# Patient Record
Sex: Male | Born: 1950 | Race: White | Hispanic: No | Marital: Married | Smoking: Former smoker
Health system: Southern US, Community
[De-identification: ages and names within clinical notes are randomized; demographics above are authoritative.]

## PROBLEM LIST (undated history)

## (undated) DIAGNOSIS — I10 Essential (primary) hypertension: Secondary | ICD-10-CM

## (undated) DIAGNOSIS — N200 Calculus of kidney: Secondary | ICD-10-CM

## (undated) DIAGNOSIS — E7800 Pure hypercholesterolemia, unspecified: Secondary | ICD-10-CM

## (undated) DIAGNOSIS — I639 Cerebral infarction, unspecified: Secondary | ICD-10-CM

## (undated) DIAGNOSIS — M109 Gout, unspecified: Secondary | ICD-10-CM

## (undated) HISTORY — PX: KIDNEY STONE SURGERY: SHX686

---
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
## 2000-01-06 ENCOUNTER — Ambulatory Visit (HOSPITAL_COMMUNITY): Admission: RE | Admit: 2000-01-06 | Discharge: 2000-01-06 | Payer: Self-pay | Admitting: Urology

## 2000-01-06 ENCOUNTER — Encounter: Payer: Self-pay | Admitting: Urology

## 2000-03-02 ENCOUNTER — Ambulatory Visit (HOSPITAL_COMMUNITY): Admission: RE | Admit: 2000-03-02 | Discharge: 2000-03-02 | Payer: Self-pay | Admitting: Urology

## 2000-03-02 ENCOUNTER — Encounter: Payer: Self-pay | Admitting: Urology

## 2003-10-10 ENCOUNTER — Ambulatory Visit (HOSPITAL_COMMUNITY): Admission: RE | Admit: 2003-10-10 | Discharge: 2003-10-10 | Payer: Self-pay | Admitting: Internal Medicine

## 2004-07-16 ENCOUNTER — Emergency Department (HOSPITAL_COMMUNITY): Admission: EM | Admit: 2004-07-16 | Discharge: 2004-07-16 | Payer: Self-pay | Admitting: Emergency Medicine

## 2009-04-09 ENCOUNTER — Ambulatory Visit (HOSPITAL_BASED_OUTPATIENT_CLINIC_OR_DEPARTMENT_OTHER): Admission: RE | Admit: 2009-04-09 | Discharge: 2009-04-09 | Payer: Self-pay | Admitting: Urology

## 2010-11-23 LAB — POCT I-STAT 4, (NA,K, GLUC, HGB,HCT)
Glucose, Bld: 104 mg/dL — ABNORMAL HIGH (ref 70–99)
HCT: 42 % (ref 39.0–52.0)
Hemoglobin: 14.3 g/dL (ref 13.0–17.0)
Potassium: 3.8 mEq/L (ref 3.5–5.1)
Sodium: 142 mEq/L (ref 135–145)

## 2010-12-31 NOTE — Op Note (Signed)
[name], Ralph Martinez                   ACCOUNT NO.:  [id number]   MEDICAL RECORD NO.:  [id number]          PATIENT TYPE:  AMB   LOCATION:  [facility]                         FACILITY:  [facility]   PHYSICIAN:  Mark C. Vernie Ammons, M.D.  DATE OF BIRTH:  [birth year]   DATE OF PROCEDURE:  04/09/2009  DATE OF DISCHARGE:                               OPERATIVE REPORT   PREOPERATIVE DIAGNOSIS:  Bladder calculi.   POSTOPERATIVE DIAGNOSIS:  Bladder calculi.   PROCEDURE:  Cystolitholapaxy (3 cm).   SURGEON:  Mark C. Vernie Ammons, M.D.   ANESTHESIA:  General.   SPECIMENS:  Stone to patient.   DRAINS:  None.   ESTIMATED BLOOD LOSS:  None.   COMPLICATIONS:  None.   INDICATIONS:  The patient is a 60 year old male with bilateral  nephrolithiasis.  He has been having his stream cut off intermittently  and quite suddenly.  He was found by KUB to have 2 large bladder  calculi.  We discussed treatment of these with cystolitholapaxy, as well  as the risks, complications, and alternatives.  The patient understands  and has elected to proceed.   DESCRIPTION OF OPERATION:  After informed consent, the patient was  brought to the major OR and placed on the table, administered general  anesthesia, then moved to the dorsal lithotomy position.  His genitalia  was then sterilely prepped and draped and an official time-out was  performed.  He received Cipro IV.   A 22-French cystoscope with 12-degree lens was then passed under direct  vision down the urethra, which was noted to be entirely normal.  The  sphincter appeared intact and there was very minimal elongation of the  prostatic urethra.  There was no lateral lobe hypertrophy, however,  there was a relatively high bladder neck.  Upon entering the bladder, I  note ureteral orifices were of normal configuration and position.  The  bladder was systematically and fully inspected and noted to be free of  any tumors or inflammatory lesions.  Two large stones were seen on  the  floor of the bladder and were photographed.   Using the 1000-micron holmium laser fiber, I then fragmented the stones  completely and then used the Microvasive evacuator to remove all of the  stone fragments from the bladder.  Re-inspection revealed no stone  fragments present within the bladder and no injury to the bladder  mucosa.  I therefore drained the bladder and the patient was awakened  and taken to recovery room in stable and satisfactory condition.  He  tolerated the procedure well and there were no intraoperative  complications.   He will be given a prescription for Pyridium 200, #28 and Vicodin HP,  #12.  He will then follow up in my office in 1 week.      Mark C. Vernie Ammons, M.D.  Electronically Signed     MCO/MEDQ  D:  04/09/2009  T:  04/09/2009  Job:  [id number]

## 2011-01-03 NOTE — Op Note (Signed)
[facility]  Patient:    Ralph Martinez, Ralph Martinez                            MRN: [id number] Proc. Date: 03/02/00 Adm. Date:  [id number] Attending:  Trisha Mangle                           Operative Report  PREOPERATIVE DIAGNOSIS:  Right ureteral calculus.  POSTOPERATIVE DIAGNOSIS:  Right ureteral calculus and right hydronephrosis.  OPERATION PERFORMED:  Cystoscopy, right retrograde pyelogram with interpretation, right ureteroscopy, electrohydraulic lithotripsy of calculus with stone basketing and extraction and double-J stent placement.  SURGEON:  Mark C. Vernie Ammons, M.D.  ANESTHESIA:  General LMA.  SPECIMENS:  Stone given to patient.  COMPLICATIONS:  None.  ESTIMATED BLOOD LOSS:  Less than 5 cc.  INDICATIONS FOR PROCEDURE:  The patient is a 60 year old white male who had lithotripsy of a large renal calculus.  He began passing the fragments and was noted to have slow progression of the residual fragments down his right ureter.  It had been four weeks since I had seen him last and he had minimal pain except for one episode but was found to have marked hydronephrosis on that side indicating some obstruction.  The need for stone extraction was discussed with the patient fully and is outlined in my office notes which have been placed on the chart.  DESCRIPTION OF PROCEDURE:  After informed consent, patient brought to the major OR and placed on the table and administered general LMA anesthesia. He was then moved to the dorsal lithotomy position and his genitalia were sterilely prepped and draped.  A 22 French cystoscope was then introduced into the bladder.  The urethra was noted to be normal, the sphincter intact and the prostatic urethra nonobstructing without lesions.  There was some mild bilobar hypertrophy.  The bladder itself was free of tumor, stones or inflammatory lesions.  The right ureteral orifice was then identified and a 5  [place] open-ended ureteral catheter was then passed into the right distal ureter and a retrograde pyelogram was performed in the standard fashion.  This revealed filling defect over the sacral region where the calcification was seen on his KUB.  There was marked hydronephrosis proximally and hydroureter seen.  No other filling defects were identified.  A 0.038 inch floppy tip guide wire was then passed through the open-ended stent and up the right ureter in the area of the renal pelvis.  I then removed the cystoscope leaving the guide wire in place and passed the 6 French rigid ureteroscope next to the guide wire.  The stone was identified and visualized by direct visualization, then I passed a Segura basket through the ureteroscope but was unable to engage the stone due to the impaction of the stone in the ureter.  There was some edema of the ureter distally.  I therefore elected to dilate the ureter.  I backloaded the cystoscope over the guide wire and passed the 10 cm dilating balloon over this and dilated to 15 [place] at 16 atmospheres for five minutes. I then deflated the balloon, reinserted the ureteroscope and again found that the stone was so impacted that it could not be extracted.  I therefore passed the 3 French EHL probe through the ureteroscope and directly visualized the probe on the stone fragmenting the stone with ease.  I then reinserted the [employer]  and engaged some of the stone fragments and extracted them without difficulty.  I passed the ureteroscope again, grasped more large fragments removing those and then passed the ureteroscope finally finding no further large fragments as I passed it approximately halfway up the ureter.  I therefore removed the ureteroscope, again backloaded the cystoscope and passed a double-J stent over the guide wire with good curl being noted when the guide wire was removed, in the renal pelvis and in the bladder.  The bladder was then  drained and the cystoscope removed.  The stent had a string attached to its distal aspect exiting through the meatus and was attached to the dorsum of the penis.  The patient was then awakened and taken to the recovery room in stable satisfactory condition.  The patient tolerated the procedure well with no intraoperative complications.  He will be given the stone as well as a prescription for pain medication using Tylox #24 and follow up in my office at the end of the week to get his stent out.  He did receive preop antibiotics. DD:  03/02/00 TD:  03/02/00 Job: [id number]

## 2011-01-03 NOTE — Op Note (Signed)
[name], [name]                             ACCOUNT NO.:  [id number]   MEDICAL RECORD NO.:  [id number]                   PATIENT TYPE:  AMB   LOCATION:  DAY                                  FACILITY:  [facility]   PHYSICIAN:  Lionel December, M.D.                 DATE OF BIRTH:  [birth year]   DATE OF PROCEDURE:  10/10/2003  DATE OF DISCHARGE:                                 OPERATIVE REPORT   PROCEDURE:  Total colonoscopy.   INDICATIONS FOR PROCEDURE:  [name] is a 60 year old Caucasian male who is here  for screening colonoscopy.  History is negative for CRC in first degree  relative, one of his uncles was treated for CRC last year (second degree  relative).   The procedure is reviewed with the patient and informed consent was  obtained.   PREOP MEDICATIONS:  Demerol 50 mg IV, Versed 5 mg IV.   FINDINGS:  Procedure performed in endoscopy suite. The patient's vital signs  and O2 saturations were monitored during the procedure and remained stable.  The patient was placed in the left lateral decubitus position and rectal  examination performed. No abnormalities noted on external or digital exam.  The Olympus videoscope was placed in the rectum and advanced under direct  vision into the sigmoid colon and beyond. The prep was felt to be  satisfactory. The scope was passed in the cecum which was identified by the  appendiceal orifice and ileocecal valve.  As the scope was withdrawn, the  colonic mucosa was carefully examined. There was a 5-6 mm polyp at the  transverse colon which was snared.  The mucosa in the rest of the colon was  normal.  The rectal mucosa similarly was normal.  The scope was retroflexed  to examine the anorectal junction and small hemorrhoids were noted below the  dentate line. The endoscope was straightened and withdrawn. The patient  tolerated the procedure well.   FINAL DIAGNOSES:  1. Single small polyp snared from transverse colon.  2. Small external  hemorrhoids.   RECOMMENDATIONS:  Stent instructions given.  I will be contacting the  patient with biopsy results.  If this polyp is an adenoma, he will return  for colonoscopy in five years.      ___________________________________________                                            Lionel December, M.D.   NR/MEDQ  D:  10/10/2003  T:  10/10/2003  Job:  [id number]   cc:   Kingsley Callander. Ouida Sills, M.D.  [address]  [place]  [place] [address]  Fax: [phone]

## 2011-02-12 ENCOUNTER — Encounter (HOSPITAL_BASED_OUTPATIENT_CLINIC_OR_DEPARTMENT_OTHER): Payer: BC Managed Care – PPO | Admitting: Internal Medicine

## 2011-02-12 ENCOUNTER — Ambulatory Visit (HOSPITAL_COMMUNITY)
Admission: RE | Admit: 2011-02-12 | Discharge: 2011-02-12 | Disposition: A | Discharge status: Home / Self Care | Payer: BC Managed Care – PPO | Source: Ambulatory Visit | Attending: Internal Medicine | Admitting: Internal Medicine

## 2011-02-12 ENCOUNTER — Other Ambulatory Visit (INDEPENDENT_AMBULATORY_CARE_PROVIDER_SITE_OTHER): Payer: Self-pay | Admitting: Internal Medicine

## 2011-02-12 DIAGNOSIS — Z800 Family history of malignant neoplasm of digestive organs: Secondary | ICD-10-CM

## 2011-02-12 DIAGNOSIS — I10 Essential (primary) hypertension: Secondary | ICD-10-CM | POA: Insufficient documentation

## 2011-02-12 DIAGNOSIS — Z860100 Personal history of colon polyps, unspecified: Secondary | ICD-10-CM | POA: Insufficient documentation

## 2011-02-12 DIAGNOSIS — Z79899 Other long term (current) drug therapy: Secondary | ICD-10-CM | POA: Insufficient documentation

## 2011-02-12 DIAGNOSIS — D129 Benign neoplasm of anus and anal canal: Secondary | ICD-10-CM | POA: Insufficient documentation

## 2011-02-12 DIAGNOSIS — D126 Benign neoplasm of colon, unspecified: Secondary | ICD-10-CM | POA: Insufficient documentation

## 2011-02-12 DIAGNOSIS — Z09 Encounter for follow-up examination after completed treatment for conditions other than malignant neoplasm: Secondary | ICD-10-CM | POA: Insufficient documentation

## 2011-02-12 DIAGNOSIS — D128 Benign neoplasm of rectum: Secondary | ICD-10-CM | POA: Insufficient documentation

## 2011-02-12 DIAGNOSIS — K633 Ulcer of intestine: Secondary | ICD-10-CM | POA: Insufficient documentation

## 2011-02-12 DIAGNOSIS — K620 Anal polyp: Secondary | ICD-10-CM

## 2011-02-12 DIAGNOSIS — E785 Hyperlipidemia, unspecified: Secondary | ICD-10-CM | POA: Insufficient documentation

## 2011-02-12 DIAGNOSIS — K644 Residual hemorrhoidal skin tags: Secondary | ICD-10-CM | POA: Insufficient documentation

## 2011-02-25 NOTE — Op Note (Signed)
NAMECASSIE, Ralph Martinez                   ACCOUNT NO.:  [id number]  MEDICAL RECORD NO.:  [id number]  LOCATION:  [facility]                          FACILITY:  [facility]  PHYSICIAN:  Lionel December, M.D.    DATE OF BIRTH:  16-Jun-1951  DATE OF PROCEDURE:  02/12/2011 DATE OF DISCHARGE:                              OPERATIVE REPORT   PROCEDURE:  Colonoscopy with snare polypectomy.  INDICATIONS:  Ralph Martinez is a 60 year old Caucasian male who is here for surveillance colonoscopy.  He had an adenoma removed from his colon in February 2005.  He is presently free of GI symptoms.  Family history is positive for colon carcinoma in two second-degree relatives.  His maternal uncle with colon carcinoma in his 31s and did about 10 years later either of a metastatic disease or second primary and maternal aunt died of colon carcinoma in her early 74s.  Procedure risks were reviewed with the patient.  Informed consent was obtained.  MEDICATIONS FOR CONSCIOUS SEDATION:  Demerol 50 mg IV, Versed 4 mg IV.  FINDINGS:  Procedure performed in endoscopy suite.  The patient's vital signs and O2 saturations were monitored during the procedure and remained stable.  The patient was placed in left lateral recumbent position and rectal examination was performed.  No abnormality noted on external or digital exam.  Pentax videoscope was placed in rectum and advanced under vision into sigmoid colon and beyond.  Preparation was excellent.  Scope was passed into cecum which was identified by appendiceal orifice and ileocecal valve.  Ileocecal valve was bifid.  It was palpated with biopsy forceps and appeared to be unremarkable. Pictures were taken of appendiceal orifice.  Short segment of TI was also examined and there were scattered erosions and few small ulcers, the largest one was 6-7 mm long.  Pictures were taken for the record followed by biopsy.  Most of these changes involved distal 5-6 cm of ileal mucosa.  As the scope was  withdrawn, colonic mucosa was carefully examined.  There was a 3-mm polyp at sigmoid colon which was ablated via cold biopsy.  There was another 6-mm polyp at rectum which was snared. There were 2 smaller polyps at rectum which were coagulated using snare tip.  Scope was retroflexed to examine anorectal junction and small hemorrhoids noted below the dentate line.  Endoscope was withdrawn. Withdrawal time was 21 minutes.  The patient tolerated the procedure well.  FINAL DIAGNOSES: 1. Ileal erosions and ulcers involving distal 5-6 cm.  Suspect these     changes may be due to OTC NSAID use.  Biopsy taken to rule out     inflammatory bowel disease. 2. Small sigmoid polyp ablated via cold biopsy, 6-mm rectal polyp     snared and 2 more smaller polyps were electrocoagulated. 3. Small external hemorrhoids.  RECOMMENDATIONS: 1. The patient advised to keep use of OTC NSAIDs to minimal.  Ideally,     he should not take any. 2. No aspirin for 1 week.  I will be contacting the patient results of     biopsy and further recommendations.______________________________ Lionel December, M.D.     NR/MEDQ  D:  02/12/2011  T:  02/12/2011  Job:  [id number]  cc:   Ralph Callander. Ouida Sills, MD Fax: [phone]  Electronically Signed by Lionel December M.D. on 02/25/2011 09:50:01 PM

## 2016-02-22 ENCOUNTER — Encounter (INDEPENDENT_AMBULATORY_CARE_PROVIDER_SITE_OTHER): Payer: Self-pay | Admitting: *Deleted

## 2016-07-13 ENCOUNTER — Encounter (HOSPITAL_COMMUNITY): Payer: Self-pay | Admitting: Emergency Medicine

## 2016-07-13 ENCOUNTER — Emergency Department (HOSPITAL_COMMUNITY): Payer: BLUE CROSS/BLUE SHIELD

## 2016-07-13 ENCOUNTER — Observation Stay (HOSPITAL_COMMUNITY)
Admission: EM | Admit: 2016-07-13 | Discharge: 2016-07-14 | Disposition: A | Discharge status: Home / Self Care | Payer: BLUE CROSS/BLUE SHIELD | Attending: Surgery | Admitting: Surgery

## 2016-07-13 DIAGNOSIS — Z87891 Personal history of nicotine dependence: Secondary | ICD-10-CM | POA: Insufficient documentation

## 2016-07-13 DIAGNOSIS — Z87442 Personal history of urinary calculi: Secondary | ICD-10-CM | POA: Insufficient documentation

## 2016-07-13 DIAGNOSIS — E785 Hyperlipidemia, unspecified: Secondary | ICD-10-CM | POA: Diagnosis not present

## 2016-07-13 DIAGNOSIS — E7800 Pure hypercholesterolemia, unspecified: Secondary | ICD-10-CM | POA: Insufficient documentation

## 2016-07-13 DIAGNOSIS — K810 Acute cholecystitis: Secondary | ICD-10-CM | POA: Diagnosis present

## 2016-07-13 DIAGNOSIS — I10 Essential (primary) hypertension: Secondary | ICD-10-CM | POA: Diagnosis not present

## 2016-07-13 DIAGNOSIS — Z79899 Other long term (current) drug therapy: Secondary | ICD-10-CM | POA: Insufficient documentation

## 2016-07-13 DIAGNOSIS — K8012 Calculus of gallbladder with acute and chronic cholecystitis without obstruction: Secondary | ICD-10-CM | POA: Diagnosis not present

## 2016-07-13 DIAGNOSIS — R1011 Right upper quadrant pain: Secondary | ICD-10-CM | POA: Diagnosis present

## 2016-07-13 HISTORY — DX: Essential (primary) hypertension: I10

## 2016-07-13 HISTORY — DX: Pure hypercholesterolemia, unspecified: E78.00

## 2016-07-13 HISTORY — DX: Calculus of kidney: N20.0

## 2016-07-13 LAB — CBC WITH DIFFERENTIAL/PLATELET
BASOS ABS: 0 10*3/uL (ref 0.0–0.1)
BASOS PCT: 0 %
EOS ABS: 0 10*3/uL (ref 0.0–0.7)
Eosinophils Relative: 0 %
HEMATOCRIT: 42.5 % (ref 39.0–52.0)
HEMOGLOBIN: 15.1 g/dL (ref 13.0–17.0)
Lymphocytes Relative: 4 %
Lymphs Abs: 0.9 10*3/uL (ref 0.7–4.0)
MCH: 31.2 pg (ref 26.0–34.0)
MCHC: 35.5 g/dL (ref 30.0–36.0)
MCV: 87.8 fL (ref 78.0–100.0)
Monocytes Absolute: 2 10*3/uL — ABNORMAL HIGH (ref 0.1–1.0)
Monocytes Relative: 8 %
NEUTROS ABS: 21.4 10*3/uL — AB (ref 1.7–7.7)
NEUTROS PCT: 88 %
Platelets: 193 10*3/uL (ref 150–400)
RBC: 4.84 MIL/uL (ref 4.22–5.81)
RDW: 13.3 % (ref 11.5–15.5)
WBC: 24.3 10*3/uL — ABNORMAL HIGH (ref 4.0–10.5)

## 2016-07-13 LAB — URINALYSIS, ROUTINE W REFLEX MICROSCOPIC
Bilirubin Urine: NEGATIVE
Glucose, UA: NEGATIVE mg/dL
KETONES UR: NEGATIVE mg/dL
LEUKOCYTES UA: NEGATIVE
NITRITE: NEGATIVE
PH: 6 (ref 5.0–8.0)

## 2016-07-13 LAB — COMPREHENSIVE METABOLIC PANEL
ALT: 25 U/L (ref 17–63)
ANION GAP: 11 (ref 5–15)
AST: 39 U/L (ref 15–41)
Albumin: 4.3 g/dL (ref 3.5–5.0)
Alkaline Phosphatase: 66 U/L (ref 38–126)
BILIRUBIN TOTAL: 1.2 mg/dL (ref 0.3–1.2)
BUN: 14 mg/dL (ref 6–20)
CO2: 24 mmol/L (ref 22–32)
Calcium: 9.9 mg/dL (ref 8.9–10.3)
Chloride: 102 mmol/L (ref 101–111)
Creatinine, Ser: 0.97 mg/dL (ref 0.61–1.24)
Glucose, Bld: 146 mg/dL — ABNORMAL HIGH (ref 65–99)
POTASSIUM: 3.6 mmol/L (ref 3.5–5.1)
Sodium: 137 mmol/L (ref 135–145)
TOTAL PROTEIN: 7.8 g/dL (ref 6.5–8.1)

## 2016-07-13 LAB — URINE MICROSCOPIC-ADD ON: SQUAMOUS EPITHELIAL / LPF: NONE SEEN

## 2016-07-13 LAB — SURGICAL PCR SCREEN
MRSA, PCR: NEGATIVE
STAPHYLOCOCCUS AUREUS: NEGATIVE

## 2016-07-13 LAB — LIPASE, BLOOD: LIPASE: 20 U/L (ref 11–51)

## 2016-07-13 MED ORDER — ONDANSETRON HCL 4 MG/2ML IJ SOLN
4.0000 mg | INTRAMUSCULAR | Status: AC | PRN
  Administered 2016-07-13 (×2): 4 mg via INTRAVENOUS
  Filled 2016-07-13 (×2): qty 2

## 2016-07-13 MED ORDER — KETOROLAC TROMETHAMINE 30 MG/ML IJ SOLN
30.0000 mg | Freq: Once | INTRAMUSCULAR | Status: AC
  Administered 2016-07-13: 30 mg via INTRAVENOUS
  Filled 2016-07-13: qty 1

## 2016-07-13 MED ORDER — IOPAMIDOL (ISOVUE-300) INJECTION 61%
100.0000 mL | Freq: Once | INTRAVENOUS | Status: AC | PRN
Start: 2016-07-13 — End: 2016-07-13
  Administered 2016-07-13: 100 mL via INTRAVENOUS

## 2016-07-13 MED ORDER — PIPERACILLIN-TAZOBACTAM 3.375 G IVPB 30 MIN
3.3750 g | Freq: Once | INTRAVENOUS | Status: AC
  Administered 2016-07-13: 3.375 g via INTRAVENOUS
  Filled 2016-07-13: qty 50

## 2016-07-13 MED ORDER — FENTANYL CITRATE (PF) 100 MCG/2ML IJ SOLN: 50.0000 ug | INTRAMUSCULAR | Status: DC | PRN

## 2016-07-13 MED ORDER — ALUM & MAG HYDROXIDE-SIMETH 200-200-20 MG/5ML PO SUSP
15.0000 mL | Freq: Four times a day (QID) | ORAL | Status: DC | PRN
  Administered 2016-07-13: 15 mL via ORAL
  Filled 2016-07-13: qty 30

## 2016-07-13 MED ORDER — FENTANYL CITRATE (PF) 100 MCG/2ML IJ SOLN
50.0000 ug | INTRAMUSCULAR | Status: AC | PRN
  Administered 2016-07-13 (×2): 50 ug via INTRAVENOUS
  Filled 2016-07-13 (×2): qty 2

## 2016-07-13 MED ORDER — TRAMADOL-ACETAMINOPHEN 37.5-325 MG PO TABS
1.0000 | ORAL_TABLET | ORAL | Status: DC | PRN
  Administered 2016-07-13: 1 via ORAL
  Administered 2016-07-13: 2 via ORAL
  Filled 2016-07-13: qty 2
  Filled 2016-07-13: qty 1

## 2016-07-13 MED ORDER — ACETAMINOPHEN 325 MG PO TABS: 650.0000 mg | ORAL_TABLET | Freq: Four times a day (QID) | ORAL | Status: DC | PRN

## 2016-07-13 MED ORDER — FENTANYL CITRATE (PF) 100 MCG/2ML IJ SOLN
25.0000 ug | INTRAMUSCULAR | Status: AC | PRN
  Administered 2016-07-13 (×2): 25 ug via INTRAVENOUS
  Filled 2016-07-13 (×2): qty 2

## 2016-07-13 MED ORDER — PIPERACILLIN-TAZOBACTAM 3.375 G IVPB
3.3750 g | Freq: Three times a day (TID) | INTRAVENOUS | Status: DC
  Administered 2016-07-13 – 2016-07-14 (×3): 3.375 g via INTRAVENOUS
  Filled 2016-07-13 (×2): qty 50

## 2016-07-13 NOTE — H&P (Addendum)
SURGICAL HISTORY AND PHYSICAL - cpt: [id number]  HISTORY OF PRESENT ILLNESS (HPI):  65 y.o. male presented to [facility] ED this morning after developing gradually worsening generalized abdominal pain 2 days ago after eating breakfast the day after Thanksgiving. He denies noticing any abdominal pain on Thanksgiving, and the pain he now describes as stabbing and aching has since continued to worsen and become more focused over his Right side. He also adds that his pain was worst yesterday, at which time he also experienced nausea and vomited, though he's been passing flatus. While at [place] ED, his pain has persisted, but has decreased and been overall controlled. Otherwise, he denies fever/chills, diarrhea, CP, or SOB and denies any prior similar episodes.  PAST MEDICAL HISTORY (PMH):      Past Medical History:  Diagnosis Date  . High cholesterol   . Hypertension   . Kidney stones      PAST SURGICAL HISTORY [facility]):       Past Surgical History:  Procedure Laterality Date  . KIDNEY STONE SURGERY       MEDICATIONS:         Prior to Admission medications   Medication Sig Start Date End Date Taking? Authorizing Provider  losartan (COZAAR) 50 MG tablet 1 tablet daily. 04/09/16  Yes Historical Provider, MD  polyvinyl alcohol (LIQUIFILM TEARS) 1.4 % ophthalmic solution Place 2 drops into both eyes as needed for dry eyes.   Yes Historical Provider, MD  pravastatin (PRAVACHOL) 20 MG tablet 1 tablet daily. 04/09/16  Yes Historical Provider, MD     ALLERGIES:      Allergies  Allergen Reactions  . Codeine Hives  . Erythromycin Hives and Itching     SOCIAL HISTORY:  Social History        Social History  . Marital status: Married    Spouse name: N/A  . Number of children: N/A  . Years of education: N/A      Occupational History  . Not on file.         Social History Main Topics  . Smoking status: Former Smoker    Packs/day: 0.08    Types: Cigarettes    Quit date:  07/22/1978  . Smokeless tobacco: Former Systems developer  . Alcohol use Yes     Comment: occasional  . Drug use: No  . Sexual activity: Not on file       Other Topics Concern  . Not on file      Social History Narrative  . No narrative on file    The patient currently resides (home / rehab facility / nursing home): Home  The patient normally is (ambulatory / bedbound): Ambulatory   FAMILY HISTORY:  History reviewed. No pertinent family history.   REVIEW OF SYSTEMS:  Constitutional: denies weight loss, fever, chills, or sweats  Eyes: denies any other vision changes, history of eye injury  ENT: denies sore throat, hearing problems  Respiratory: denies shortness of breath, wheezing  Cardiovascular: denies chest pain, palpitations  Gastrointestinal: abdominal pain, N/V, and bowel function as per HPI Genitourinary: denies burning with urination or urinary frequency Musculoskeletal: denies any other joint pains or cramps  Skin: denies any other rashes or skin discolorations  Neurological: denies any other headache, dizziness, weakness  Psychiatric: denies any other depression, anxiety   All other review of systems were negative   VITAL SIGNS:  Temp:  [97.6 F (36.4 C)] 97.6 F (36.4 C) (11/26 0824) Pulse Rate:  [79-96] 79 (11/26 1025) Resp:  [  18-20] 18 (11/26 1025) BP: (154-171)/(84-101) 163/86 (11/26 1025) SpO2:  [97 %-100 %] 97 % (11/26 1025) Weight:  [83.9 kg (185 lb)] 83.9 kg (185 lb) (11/26 0825)     Height: 5\' 9"  (175.3 cm) Weight: 83.9 kg (185 lb) BMI (Calculated): 27.4   INTAKE/OUTPUT:  This shift: No intake/output data recorded.  Last 2 shifts: @IOLAST2SHIFTS @   PHYSICAL EXAM:  Constitutional:  -- Normal body habitus  -- Awake, alert, and oriented x3  Eyes:  -- Pupils equally round and reactive to light  -- No scleral icterus  Ear, nose, and throat:  -- No jugular venous distension  Pulmonary:  -- No crackles  -- Equal breath sounds bilaterally --  Breathing non-labored at rest Cardiovascular:  -- S1, S2 present  -- No pericardial rubs Gastrointestinal:  -- Abdomen soft, mild-/moderately- tender RUQ >> overall abdomen, nondistended, no guarding/rebound  -- No abdominal masses appreciated, pulsatile or otherwise  Musculoskeletal and Integumentary:  -- Wounds or skin discoloration: None appreciated -- Extremities: B/L UE and LE FROM, hands and feet warm, no edema  Neurologic:  -- Motor function: intact and symmetric -- Sensation: intact and symmetric  Pulse/Doppler Exam: (p=palpable; d=doppler signals; 0=none)   Labs:  CBC Latest Ref Rng & Units 07/13/2016 04/09/2009  WBC 4.0 - 10.5 K/uL 24.3(H) -  Hemoglobin 13.0 - 17.0 g/dL 15.1 14.3  Hematocrit 39.0 - 52.0 % 42.5 42.0  Platelets 150 - 400 K/uL 193 -   CMP Latest Ref Rng & Units 07/13/2016 04/09/2009  Glucose 65 - 99 mg/dL 146(H) 104(H)  BUN 6 - 20 mg/dL 14 -  Creatinine 0.61 - 1.24 mg/dL 0.97 -  Sodium 135 - 145 mmol/L 137 142  Potassium 3.5 - 5.1 mmol/L 3.6 3.8  Chloride 101 - 111 mmol/L 102 -  CO2 22 - 32 mmol/L 24 -  Calcium 8.9 - 10.3 mg/dL 9.9 -  Total Protein 6.5 - 8.1 g/dL 7.8 -  Total Bilirubin 0.3 - 1.2 mg/dL 1.2 -  Alkaline Phos 38 - 126 U/L 66 -  AST 15 - 41 U/L 39 -  ALT 17 - 63 U/L 25 -    Imaging studies:  CT Abdomen and Pelvis with Contrast (07/13/2016) 1. Gallbladder is distended. There is pericholecystic fluid/edema and probable gallbladder wall thickening. Also suspect gallstones. Findings are highly suspicious for acute cholecystitis. Would consider right upper quadrant ultrasound for confirmation. 2. Questionable thickening of the walls of the hepatic flexure and right colon, with minimal diverticulosis in this segment of the colon. I suspect this is reactive thickening secondary to adjacent overlying cholecystitis. A localized colitis of infectious or inflammatory nature is felt to be less likely. There is minimal diverticulosis within this  segment of the colon but no convincing signs of an acute diverticulitis. 3. Bilateral nephrolithiasis. No associated hydronephrosis. No ureteral calculi. Bladder stones. 4. Aortic atherosclerosis.  Assessment/Plan: (ICD-10's: K81.0) 65 y.o. male with acute cholecystitis and focal/segmental adjacent hepatic flexure colonic inflammation likely secondary to cholecystitis ratheter than minimal diverticulosis, complicated by pertinent comorbidities including HTN, HLD, and chronic nephrolithiasis with bladder but no visualized ureteral calculi.              - Toradol x 1 for pain control              - pain control prn (fentanyl/Tramadol, may consider Toradol prn)             - all risks, benefits, and alternatives to cholecystectomy tomorrow morning were discussed with patient,  who elect(s) to proceed, all his questions were answered to his expressed satisfaction, and informed consent was obtained  - will admit to surgical service considering routine co-morbidities, will hold Cozaar since currently normotensive             - antibiotics per hospital antibiogram, Zosyn should also cover colonic organisms associated with colitis             - clear liquids diet for now and NPO after midnight             - ambulation encouraged, DVT prophylaxis  All of the above findings and recommendations were discussed with the patient, his family, and ED physician, and all of patient's and his family's questions were answered to their expressed satisfaction.  Thank you for the opportunity to participate in this patient's care.   -- Marilynne Drivers Rosana Hoes, MD, [place]: [facility] Office: [phone]

## 2016-07-13 NOTE — Progress Notes (Signed)
Patient sleeping comfortably on room air with SpO2 of 97%. RT will deliver and  Review IS with patient once he has completed intake procedure on the floor.

## 2016-07-13 NOTE — Consult Note (Signed)
SURGICAL CONSULTATION NOTE (initial) - cpt: [phone]  HISTORY OF PRESENT ILLNESS (HPI):  65 y.o. male presented to [facility] this morning after developing gradually worsening generalized abdominal pain 2 days ago after eating breakfast the day after Thanksgiving. He denies noticing any abdominal pain on Thanksgiving, and the pain he now describes as stabbing and aching has since continued to worsen and become more focused over his Right side. He also adds that his pain was worst yesterday, at which time he also experienced nausea and vomited, though he's been passing flatus. While at [facility] ED, his pain has persisted, but has decreased and been overall controlled. Otherwise, he denies fever/chills, diarrhea, CP, or SOB and denies any prior similar episodes.  Surgery is consulted by ED physician Dr. Thurnell Garbe in this context for evaluation and management of acute cholecystitis.  PAST MEDICAL HISTORY (PMH):  Past Medical History:  Diagnosis Date  . High cholesterol   . Hypertension   . Kidney stones      PAST SURGICAL HISTORY [facility]):  Past Surgical History:  Procedure Laterality Date  . KIDNEY STONE SURGERY       MEDICATIONS:  Prior to Admission medications   Medication Sig Start Date End Date Taking? Authorizing Provider  losartan (COZAAR) 50 MG tablet 1 tablet daily. 04/09/16  Yes Historical Provider, MD  polyvinyl alcohol (LIQUIFILM TEARS) 1.4 % ophthalmic solution Place 2 drops into both eyes as needed for dry eyes.   Yes Historical Provider, MD  pravastatin (PRAVACHOL) 20 MG tablet 1 tablet daily. 04/09/16  Yes Historical Provider, MD     ALLERGIES:  Allergies  Allergen Reactions  . Codeine Hives  . Erythromycin Hives and Itching     SOCIAL HISTORY:  Social History   Social History  . Marital status: Married    Spouse name: N/A  . Number of children: N/A  . Years of education: N/A   Occupational History  . Not on file.   Social History Main Topics  . Smoking status: Former  Smoker    Packs/day: 0.08    Types: Cigarettes    Quit date: 07/22/1978  . Smokeless tobacco: Former Systems developer  . Alcohol use Yes     Comment: occasional  . Drug use: No  . Sexual activity: Not on file   Other Topics Concern  . Not on file   Social History Narrative  . No narrative on file    The patient currently resides (home / rehab facility / nursing home): Home  The patient normally is (ambulatory / bedbound): Ambulatory   FAMILY HISTORY:  History reviewed. No pertinent family history.   REVIEW OF SYSTEMS:  Constitutional: denies weight loss, fever, chills, or sweats  Eyes: denies any other vision changes, history of eye injury  ENT: denies sore throat, hearing problems  Respiratory: denies shortness of breath, wheezing  Cardiovascular: denies chest pain, palpitations  Gastrointestinal: abdominal pain, N/V, and bowel function as per HPI Genitourinary: denies burning with urination or urinary frequency Musculoskeletal: denies any other joint pains or cramps  Skin: denies any other rashes or skin discolorations  Neurological: denies any other headache, dizziness, weakness  Psychiatric: denies any other depression, anxiety   All other review of systems were negative   VITAL SIGNS:  Temp:  [97.6 F (36.4 C)] 97.6 F (36.4 C) (11/26 0824) Pulse Rate:  [79-96] 79 (11/26 1025) Resp:  [18-20] 18 (11/26 1025) BP: (154-171)/(84-101) 163/86 (11/26 1025) SpO2:  [97 %-100 %] 97 % (11/26 1025) Weight:  [83.9 kg (  185 lb)] 83.9 kg (185 lb) (11/26 0825)     Height: 5\' 9"  (175.3 cm) Weight: 83.9 kg (185 lb) BMI (Calculated): 27.4   INTAKE/OUTPUT:  This shift: No intake/output data recorded.  Last 2 shifts: @IOLAST2SHIFTS @   PHYSICAL EXAM:  Constitutional:  -- Normal body habitus  -- Awake, alert, and oriented x3  Eyes:  -- Pupils equally round and reactive to light  -- No scleral icterus  Ear, nose, and throat:  -- No jugular venous distension  Pulmonary:  -- No crackles   -- Equal breath sounds bilaterally -- Breathing non-labored at rest Cardiovascular:  -- S1, S2 present  -- No pericardial rubs Gastrointestinal:  -- Abdomen soft, mild-/moderately- tender RUQ >> overall abdomen, nondistended, no guarding/rebound  -- No abdominal masses appreciated, pulsatile or otherwise  Musculoskeletal and Integumentary:  -- Wounds or skin discoloration: None appreciated -- Extremities: B/L UE and LE FROM, hands and feet warm, no edema  Neurologic:  -- Motor function: intact and symmetric -- Sensation: intact and symmetric  Pulse/Doppler Exam: (p=palpable; d=doppler signals; 0=none)   Labs:  CBC Latest Ref Rng & Units 07/13/2016 04/09/2009  WBC 4.0 - 10.5 K/uL 24.3(H) -  Hemoglobin 13.0 - 17.0 g/dL 15.1 14.3  Hematocrit 39.0 - 52.0 % 42.5 42.0  Platelets 150 - 400 K/uL 193 -   CMP Latest Ref Rng & Units 07/13/2016 04/09/2009  Glucose 65 - 99 mg/dL 146(H) 104(H)  BUN 6 - 20 mg/dL 14 -  Creatinine 0.61 - 1.24 mg/dL 0.97 -  Sodium 135 - 145 mmol/L 137 142  Potassium 3.5 - 5.1 mmol/L 3.6 3.8  Chloride 101 - 111 mmol/L 102 -  CO2 22 - 32 mmol/L 24 -  Calcium 8.9 - 10.3 mg/dL 9.9 -  Total Protein 6.5 - 8.1 g/dL 7.8 -  Total Bilirubin 0.3 - 1.2 mg/dL 1.2 -  Alkaline Phos 38 - 126 U/L 66 -  AST 15 - 41 U/L 39 -  ALT 17 - 63 U/L 25 -    Imaging studies:  CT Abdomen and Pelvis with Contrast (07/13/2016) 1. Gallbladder is distended. There is pericholecystic fluid/edema and probable gallbladder wall thickening. Also suspect gallstones. Findings are highly suspicious for acute cholecystitis. Would consider right upper quadrant ultrasound for confirmation. 2. Questionable thickening of the walls of the hepatic flexure and right colon, with minimal diverticulosis in this segment of the colon. I suspect this is reactive thickening secondary to adjacent overlying cholecystitis. A localized colitis of infectious or inflammatory nature is felt to be less likely. There is  minimal diverticulosis within this segment of the colon but no convincing signs of an acute diverticulitis. 3. Bilateral nephrolithiasis. No associated hydronephrosis. No ureteral calculi. Bladder stones. 4. Aortic atherosclerosis.  Assessment/Plan: (ICD-10's: K81.0) 65 y.o. male with acute cholecystitis and focal/segmental adjacent hepatic flexure colonic inflammation likely secondary to cholecystitis ratheter than minimal diverticulosis, complicated by pertinent comorbidities including HTN, HLD, and chronic nephrolithiasis with bladder but no visualized ureteral calculi.   - Toradol x 1 for pain control   - pain control prn (fentanyl/Tramadol, may consider Toradol prn)  - all risks, benefits, and alternatives to cholecystectomy tomorrow morning were discussed with patient, who elect(s) to proceed, all his questions were answered to his expressed satisfaction, and informed consent was obtained  - antibiotics per hospital antibiogram, Zosyn should also cover colonic organisms associated with colitis  - clear liquids diet for now and NPO after midnight  - ambulation encouraged, DVT prophylaxis  All of the above  findings and recommendations were discussed with the patient, his family, and ED physician, and all of patient's and his family's questions were answered to their expressed satisfaction.  Thank you for the opportunity to participate in this patient's care.   -- Marilynne Drivers Rosana Hoes, MD, [place]: [facility] Office: [phone]

## 2016-07-13 NOTE — ED Provider Notes (Signed)
[facility] DEPT Provider Note   CSN: [id number] Arrival date & time: 07/13/16  0810     History   Chief Complaint Chief Complaint  Patient presents with  . Abdominal Pain    HPI Ralph Martinez is a 65 y.o. male.  HPI  Pt was seen at [facility].  Per pt, c/o gradual onset and persistence of constant generalized abd "pain" for the past 2 days.  Has been associated with multiple intermittent episodes of N/V/D.  Describes the abd pain as "stabbing" and "aching."  Denies fevers, no back pain, no rash, no CP/SOB, no black or blood in stools or emesis.      Past Medical History:  Diagnosis Date  . High cholesterol   . Hypertension   . Kidney stones     There are no active problems to display for this patient.   Past Surgical History:  Procedure Laterality Date  . KIDNEY STONE SURGERY         Home Medications    Prior to Admission medications   Not on File    Family History History reviewed. No pertinent family history.  Social History Social History  Substance Use Topics  . Smoking status: Former Smoker    Packs/day: 0.08    Types: Cigarettes    Quit date: 07/22/1978  . Smokeless tobacco: Former Systems developer  . Alcohol use Yes     Comment: occasional     Allergies   Codeine   Review of Systems Review of Systems ROS: Statement: All systems negative except as marked or noted in the HPI; Constitutional: Negative for fever and chills. ; ; Eyes: Negative for eye pain, redness and discharge. ; ; ENMT: Negative for ear pain, hoarseness, nasal congestion, sinus pressure and sore throat. ; ; Cardiovascular: Negative for chest pain, palpitations, diaphoresis, dyspnea and peripheral edema. ; ; Respiratory: Negative for cough, wheezing and stridor. ; ; Gastrointestinal: +N/V/D, abd pain. Negative for blood in stool, hematemesis, jaundice and rectal bleeding. . ; ; Genitourinary: Negative for dysuria, flank pain and hematuria. ; ; Musculoskeletal: Negative for back pain and neck  pain. Negative for swelling and trauma.; ; Skin: Negative for pruritus, rash, abrasions, blisters, bruising and skin lesion.; ; Neuro: Negative for headache, lightheadedness and neck stiffness. Negative for weakness, altered level of consciousness, altered mental status, extremity weakness, paresthesias, involuntary movement, seizure and syncope.       Physical Exam Updated Vital Signs BP 170/84 (BP Location: Right Arm)   Pulse 86   Temp 97.6 F (36.4 C) (Oral)   Resp 20   Ht 5\' 9"  (1.753 m)   Wt 185 lb (83.9 kg)   SpO2 97%   BMI 27.32 kg/m   Physical Exam 0830: Physical examination:  Nursing notes reviewed; Vital signs and O2 SAT reviewed;  Constitutional: Well developed, Well nourished, Well hydrated, In no acute distress; Head:  Normocephalic, atraumatic; Eyes: EOMI, PERRL, No scleral icterus; ENMT: Mouth and pharynx normal, Mucous membranes moist; Neck: Supple, Full range of motion, No lymphadenopathy; Cardiovascular: Regular rate and rhythm, No gallop; Respiratory: Breath sounds clear & equal bilaterally, No wheezes.  Speaking full sentences with ease, Normal respiratory effort/excursion; Chest: Nontender, Movement normal; Abdomen: Soft, +generalized tenderness to palp. No rebound or guarding. Nondistended, Normal bowel sounds; Genitourinary: No CVA tenderness; Extremities: Pulses normal, No tenderness, No edema, No calf edema or asymmetry.; Neuro: AA&Ox3, Major CN grossly intact.  Speech clear. No gross focal motor or sensory deficits in extremities.; Skin: Color normal, Warm, Dry.  ED Treatments / Results  Labs (all labs ordered are listed, but only abnormal results are displayed)   EKG  EKG Interpretation None       Radiology   Procedures Procedures (including critical care time)  Medications Ordered in ED Medications  ondansetron (ZOFRAN) injection 4 mg (not administered)  fentaNYL (SUBLIMAZE) injection 25 mcg (not administered)     Initial Impression /  Assessment and Plan / ED Course  I have reviewed the triage vital signs and the nursing notes.  Pertinent labs & imaging results that were available during my care of the patient were reviewed by me and considered in my medical decision making (see chart for details).  MDM Reviewed: previous chart, nursing note and vitals Reviewed previous: labs Interpretation: labs and CT scan   Results for orders placed or performed during the hospital encounter of 07/13/16  Comprehensive metabolic panel  Result Value Ref Range   Sodium 137 135 - 145 mmol/L   Potassium 3.6 3.5 - 5.1 mmol/L   Chloride 102 101 - 111 mmol/L   CO2 24 22 - 32 mmol/L   Glucose, Bld 146 (H) 65 - 99 mg/dL   BUN 14 6 - 20 mg/dL   Creatinine, Ser 0.97 0.61 - 1.24 mg/dL   Calcium 9.9 8.9 - 10.3 mg/dL   Total Protein 7.8 6.5 - 8.1 g/dL   Albumin 4.3 3.5 - 5.0 g/dL   AST 39 15 - 41 U/L   ALT 25 17 - 63 U/L   Alkaline Phosphatase 66 38 - 126 U/L   Total Bilirubin 1.2 0.3 - 1.2 mg/dL   GFR calc non Af Amer >60 >60 mL/min   GFR calc Af Amer >60 >60 mL/min   Anion gap 11 5 - 15  Lipase, blood  Result Value Ref Range   Lipase 20 11 - 51 U/L  CBC with Differential  Result Value Ref Range   WBC 24.3 (H) 4.0 - 10.5 K/uL   RBC 4.84 4.22 - 5.81 MIL/uL   Hemoglobin 15.1 13.0 - 17.0 g/dL   HCT 42.5 39.0 - 52.0 %   MCV 87.8 78.0 - 100.0 fL   MCH 31.2 26.0 - 34.0 pg   MCHC 35.5 30.0 - 36.0 g/dL   RDW 13.3 11.5 - 15.5 %   Platelets 193 150 - 400 K/uL   Neutrophils Relative % 88 %   Neutro Abs 21.4 (H) 1.7 - 7.7 K/uL   Lymphocytes Relative 4 %   Lymphs Abs 0.9 0.7 - 4.0 K/uL   Monocytes Relative 8 %   Monocytes Absolute 2.0 (H) 0.1 - 1.0 K/uL   Eosinophils Relative 0 %   Eosinophils Absolute 0.0 0.0 - 0.7 K/uL   Basophils Relative 0 %   Basophils Absolute 0.0 0.0 - 0.1 K/uL   Ct Abdomen Pelvis W Contrast Result Date: 07/13/2016 CLINICAL DATA:  Patient c/o bilateral lower abd pain that started on Friday. Patient reports  nausea, vomiting, and low grade fever. Denies any diarrhea or urinary symptoms. Per patient last BM normal BM Friday-denies any blood in stool. EXAM: CT ABDOMEN AND PELVIS WITH CONTRAST TECHNIQUE: Multidetector CT imaging of the abdomen and pelvis was performed using the standard protocol following bolus administration of intravenous contrast. CONTRAST:  180mL ISOVUE-300 IOPAMIDOL (ISOVUE-300) INJECTION 61% COMPARISON:  None. FINDINGS: Lower chest: No acute abnormality. Hepatobiliary: Gallbladder is distended and there is pericholecystic edema, with probable gallbladder wall thickening. Liver appears normal. No bile duct dilatation. Pancreas: Unremarkable. No pancreatic ductal dilatation or  surrounding inflammatory changes. Spleen: Normal in size without focal abnormality. Adrenals/Urinary Tract: Adrenal glands appear normal. Small renal cysts bilaterally. Multiple renal stones bilaterally, including a large staghorn calculus within the upper pole right renal pelvis measuring approximately 2.3 cm greatest dimension. No hydronephrosis bilaterally. No ureteral calculi. Two bladder stones appreciated, largest measuring 1.1 cm greatest dimension. Bladder otherwise unremarkable, partially decompressed. Stomach/Bowel: Bowel is normal in caliber. Questionable thickening of the walls of the hepatic flexure and right colon, with minimal diverticulosis in this segment of the colon. Appendix is normal. Stomach appears normal. Vascular/Lymphatic: Mild atherosclerotic changes of the normal caliber abdominal aorta. No acute vascular abnormality. No enlarged lymph nodes seen within the abdomen or pelvis. Reproductive: Unremarkable. Other: No abscess collection.  No free intraperitoneal air. Musculoskeletal: Mild degenerative change within the thoracolumbar spine. No acute or suspicious osseous finding. Superficial soft tissues are unremarkable. IMPRESSION: 1. Gallbladder is distended. There is pericholecystic fluid/edema and  probable gallbladder wall thickening. Also suspect gallstones. Findings are highly suspicious for acute cholecystitis. Would consider right upper quadrant ultrasound for confirmation. 2. Questionable thickening of the walls of the hepatic flexure and right colon, with minimal diverticulosis in this segment of the colon. I suspect this is reactive thickening secondary to adjacent overlying cholecystitis. A localized colitis of infectious or inflammatory nature is felt to be less likely. There is minimal diverticulosis within this segment of the colon but no convincing signs of an acute diverticulitis. 3. Bilateral nephrolithiasis. No associated hydronephrosis. No ureteral calculi. Bladder stones. 4. Aortic atherosclerosis. Electronically Signed   By: Franki Cabot M.D.   On: 07/13/2016 09:58    1025:  IV zosyn ordered. Pt remedicated for pain and nausea. Dx and testing d/w pt and family.  Questions answered.  Verb understanding, agreeable to admit.  T/C to General Surgery Dr. Rosana Hoes, case discussed, including:  HPI, pertinent PM/SHx, VS/PE, dx testing, ED course and treatment:  Agreeable to admit, requests he will come to the ED for evaluation.     Final Clinical Impressions(s) / ED Diagnoses   Final diagnoses:  None    New Prescriptions New Prescriptions   No medications on file     Haitham Elrick, DO 07/15/16 1645

## 2016-07-13 NOTE — ED Triage Notes (Signed)
Patient c/o bilateral lower abd pain that started on Friday. Patient reports nausea, vomiting, and low grade fever. Denies any diarrhea or urinary symptoms. Per patient last BM normal BM Friday-denies any blood in stool.

## 2016-07-14 ENCOUNTER — Encounter (HOSPITAL_COMMUNITY): Payer: Self-pay | Admitting: *Deleted

## 2016-07-14 ENCOUNTER — Observation Stay (HOSPITAL_COMMUNITY): Payer: BLUE CROSS/BLUE SHIELD | Admitting: Anesthesiology

## 2016-07-14 ENCOUNTER — Encounter (HOSPITAL_COMMUNITY)
Admission: EM | Disposition: A | Discharge status: Home / Self Care | Payer: Self-pay | Source: Home / Self Care | Attending: Emergency Medicine

## 2016-07-14 HISTORY — PX: CHOLECYSTECTOMY: SHX55

## 2016-07-14 LAB — COMPREHENSIVE METABOLIC PANEL
ALK PHOS: 102 U/L (ref 38–126)
ALT: 173 U/L — AB (ref 17–63)
AST: 163 U/L — AB (ref 15–41)
Albumin: 3.4 g/dL — ABNORMAL LOW (ref 3.5–5.0)
Anion gap: 8 (ref 5–15)
BUN: 18 mg/dL (ref 6–20)
CALCIUM: 9.2 mg/dL (ref 8.9–10.3)
CHLORIDE: 101 mmol/L (ref 101–111)
CO2: 28 mmol/L (ref 22–32)
CREATININE: 1.05 mg/dL (ref 0.61–1.24)
GFR calc non Af Amer: >60 mL/min (ref >60)
Glucose, Bld: 117 mg/dL — ABNORMAL HIGH (ref 65–99)
Potassium: 3.9 mmol/L (ref 3.5–5.1)
SODIUM: 137 mmol/L (ref 135–145)
Total Bilirubin: 2.4 mg/dL — ABNORMAL HIGH (ref 0.3–1.2)
Total Protein: 6.7 g/dL (ref 6.5–8.1)

## 2016-07-14 LAB — CBC WITH DIFFERENTIAL/PLATELET
BASOS PCT: 0 %
Basophils Absolute: 0 10*3/uL (ref 0.0–0.1)
EOS ABS: 0 10*3/uL (ref 0.0–0.7)
EOS PCT: 0 %
HCT: 40.1 % (ref 39.0–52.0)
Hemoglobin: 13.6 g/dL (ref 13.0–17.0)
LYMPHS ABS: 0.8 10*3/uL (ref 0.7–4.0)
Lymphocytes Relative: 4 %
MCH: 30.6 pg (ref 26.0–34.0)
MCHC: 33.9 g/dL (ref 30.0–36.0)
MCV: 90.1 fL (ref 78.0–100.0)
MONO ABS: 1.6 10*3/uL — AB (ref 0.1–1.0)
MONOS PCT: 8 %
NEUTROS PCT: 88 %
Neutro Abs: 17.2 10*3/uL — ABNORMAL HIGH (ref 1.7–7.7)
PLATELETS: 189 10*3/uL (ref 150–400)
RBC: 4.45 MIL/uL (ref 4.22–5.81)
RDW: 13.8 % (ref 11.5–15.5)
WBC: 19.6 10*3/uL — ABNORMAL HIGH (ref 4.0–10.5)

## 2016-07-14 LAB — URINE CULTURE: CULTURE: NO GROWTH

## 2016-07-14 SURGERY — LAPAROSCOPIC CHOLECYSTECTOMY
Anesthesia: General | Site: Abdomen

## 2016-07-14 MED ORDER — DEXAMETHASONE SODIUM PHOSPHATE 4 MG/ML IJ SOLN
4.0000 mg | Freq: Once | INTRAMUSCULAR | Status: AC
  Administered 2016-07-14: 4 mg via INTRAVENOUS

## 2016-07-14 MED ORDER — FENTANYL CITRATE (PF) 100 MCG/2ML IJ SOLN
INTRAMUSCULAR | Status: AC
  Filled 2016-07-14: qty 2

## 2016-07-14 MED ORDER — MIDAZOLAM HCL 2 MG/2ML IJ SOLN
INTRAMUSCULAR | Status: AC
  Filled 2016-07-14: qty 2

## 2016-07-14 MED ORDER — FENTANYL CITRATE (PF) 100 MCG/2ML IJ SOLN
INTRAMUSCULAR | Status: DC | PRN
  Administered 2016-07-14: 25 ug via INTRAVENOUS
  Administered 2016-07-14 (×4): 50 ug via INTRAVENOUS
  Administered 2016-07-14: 25 ug via INTRAVENOUS

## 2016-07-14 MED ORDER — METRONIDAZOLE 500 MG PO TABS: 500.0000 mg | ORAL_TABLET | Freq: Three times a day (TID) | ORAL | 0 refills | Status: AC

## 2016-07-14 MED ORDER — BUPIVACAINE HCL (PF) 0.5 % IJ SOLN
INTRAMUSCULAR | Status: AC
  Filled 2016-07-14: qty 30

## 2016-07-14 MED ORDER — ONDANSETRON HCL 4 MG/2ML IJ SOLN
4.0000 mg | Freq: Once | INTRAMUSCULAR | Status: AC
  Administered 2016-07-14: 4 mg via INTRAVENOUS

## 2016-07-14 MED ORDER — LACTATED RINGERS IV SOLN
INTRAVENOUS | Status: DC
  Administered 2016-07-14: 09:00:00 via INTRAVENOUS

## 2016-07-14 MED ORDER — HYDROMORPHONE HCL 1 MG/ML IJ SOLN: 0.2500 mg | INTRAMUSCULAR | Status: DC | PRN

## 2016-07-14 MED ORDER — CIPROFLOXACIN HCL 500 MG PO TABS: 500.0000 mg | ORAL_TABLET | Freq: Two times a day (BID) | ORAL | 0 refills | Status: AC

## 2016-07-14 MED ORDER — DEXAMETHASONE SODIUM PHOSPHATE 4 MG/ML IJ SOLN
INTRAMUSCULAR | Status: AC
  Filled 2016-07-14: qty 1

## 2016-07-14 MED ORDER — ROCURONIUM 10MG/ML (10ML) SYRINGE FOR MEDFUSION PUMP - OPTIME
INTRAVENOUS | Status: DC | PRN
  Administered 2016-07-14: 5 mg via INTRAVENOUS
  Administered 2016-07-14: 20 mg via INTRAVENOUS
  Administered 2016-07-14 (×2): 5 mg via INTRAVENOUS

## 2016-07-14 MED ORDER — MIDAZOLAM HCL 2 MG/2ML IJ SOLN
1.0000 mg | INTRAMUSCULAR | Status: DC | PRN
  Administered 2016-07-14: 2 mg via INTRAVENOUS

## 2016-07-14 MED ORDER — GLYCOPYRROLATE 0.2 MG/ML IJ SOLN
INTRAMUSCULAR | Status: DC | PRN
  Administered 2016-07-14: .5 mg via INTRAVENOUS

## 2016-07-14 MED ORDER — LIDOCAINE HCL (PF) 1 % IJ SOLN
INTRAMUSCULAR | Status: AC
  Filled 2016-07-14: qty 30

## 2016-07-14 MED ORDER — LIDOCAINE HCL (CARDIAC) 10 MG/ML IV SOLN
INTRAVENOUS | Status: DC | PRN
  Administered 2016-07-14: 50 mg via INTRAVENOUS

## 2016-07-14 MED ORDER — SUCCINYLCHOLINE 20MG/ML (10ML) SYRINGE FOR MEDFUSION PUMP - OPTIME
INTRAMUSCULAR | Status: DC | PRN
  Administered 2016-07-14: 110 mg via INTRAVENOUS

## 2016-07-14 MED ORDER — LIDOCAINE HCL 1 % IJ SOLN
INTRAMUSCULAR | Status: DC | PRN
  Administered 2016-07-14: 20 mL via INTRAMUSCULAR

## 2016-07-14 MED ORDER — HEMOSTATIC AGENTS (NO CHARGE) OPTIME
TOPICAL | Status: DC | PRN
  Administered 2016-07-14: 1 via TOPICAL

## 2016-07-14 MED ORDER — ROCURONIUM BROMIDE 50 MG/5ML IV SOLN
INTRAVENOUS | Status: AC
Start: 2016-07-14 — End: 2016-07-14
  Filled 2016-07-14: qty 1

## 2016-07-14 MED ORDER — LIDOCAINE HCL (PF) 1 % IJ SOLN
INTRAMUSCULAR | Status: AC
  Filled 2016-07-14: qty 5

## 2016-07-14 MED ORDER — PROPOFOL 10 MG/ML IV BOLUS
INTRAVENOUS | Status: DC | PRN
  Administered 2016-07-14: 160 mg via INTRAVENOUS

## 2016-07-14 MED ORDER — SODIUM CHLORIDE 0.9 % IR SOLN
Status: DC | PRN
  Administered 2016-07-14: 1000 mL

## 2016-07-14 MED ORDER — TRAMADOL-ACETAMINOPHEN 37.5-325 MG PO TABS: 1.0000 | ORAL_TABLET | ORAL | 0 refills | Status: DC | PRN

## 2016-07-14 MED ORDER — ONDANSETRON HCL 4 MG/2ML IJ SOLN
INTRAMUSCULAR | Status: AC
  Filled 2016-07-14: qty 2

## 2016-07-14 MED ORDER — FENTANYL CITRATE (PF) 250 MCG/5ML IJ SOLN
INTRAMUSCULAR | Status: AC
  Filled 2016-07-14: qty 5

## 2016-07-14 MED ORDER — NEOSTIGMINE METHYLSULFATE 10 MG/10ML IV SOLN
INTRAVENOUS | Status: DC | PRN
  Administered 2016-07-14: 3 mg via INTRAVENOUS

## 2016-07-14 MED ORDER — SUCCINYLCHOLINE CHLORIDE 20 MG/ML IJ SOLN
INTRAMUSCULAR | Status: AC
  Filled 2016-07-14: qty 1

## 2016-07-14 MED ORDER — GLYCOPYRROLATE 0.2 MG/ML IJ SOLN
INTRAMUSCULAR | Status: AC
  Filled 2016-07-14: qty 4

## 2016-07-14 MED ORDER — PROPOFOL 10 MG/ML IV BOLUS
INTRAVENOUS | Status: AC
  Filled 2016-07-14: qty 40

## 2016-07-14 SURGICAL SUPPLY — 51 items
ADH SKN CLS APL DERMABOND .7 (GAUZE/BANDAGES/DRESSINGS) ×1
APL SRG 38 LTWT LNG FL B (MISCELLANEOUS) ×1
APPLICATOR ARISTA FLEXITIP XL (MISCELLANEOUS) ×2 IMPLANT
APPLIER CLIP LAPSCP 10X32 DD (CLIP) ×3 IMPLANT
BAG HAMPER (MISCELLANEOUS) ×3 IMPLANT
BAG SPEC RTRVL LRG 6X4 10 (ENDOMECHANICALS) ×1
CHLORAPREP W/TINT 26ML (MISCELLANEOUS) ×3 IMPLANT
CLOTH BEACON ORANGE TIMEOUT ST (SAFETY) ×3 IMPLANT
COVER LIGHT HANDLE STERIS (MISCELLANEOUS) ×6 IMPLANT
DECANTER SPIKE VIAL GLASS SM (MISCELLANEOUS) ×6 IMPLANT
DERMABOND ADVANCED (GAUZE/BANDAGES/DRESSINGS) ×2
DERMABOND ADVANCED .7 DNX12 (GAUZE/BANDAGES/DRESSINGS) ×1 IMPLANT
DEVICE TROCAR PUNCTURE CLOSURE (ENDOMECHANICALS) ×3 IMPLANT
ELECT REM PT RETURN 9FT ADLT (ELECTROSURGICAL) ×3
ELECTRODE REM PT RTRN 9FT ADLT (ELECTROSURGICAL) ×1 IMPLANT
FILTER SMOKE EVAC LAPAROSHD (FILTER) ×3 IMPLANT
FORMALIN 10 PREFIL 120ML (MISCELLANEOUS) ×1 IMPLANT
FORMALIN 10 PREFIL 480ML (MISCELLANEOUS) ×2 IMPLANT
GLOVE BIOGEL PI IND STRL 7.0 (GLOVE) ×1 IMPLANT
GLOVE BIOGEL PI IND STRL 7.5 (GLOVE) ×1 IMPLANT
GLOVE BIOGEL PI INDICATOR 7.0 (GLOVE) ×2
GLOVE BIOGEL PI INDICATOR 7.5 (GLOVE) ×2
GLOVE ECLIPSE 7.0 STRL STRAW (GLOVE) ×3 IMPLANT
GOWN STRL REUS W/ TWL XL LVL3 (GOWN DISPOSABLE) ×1 IMPLANT
GOWN STRL REUS W/TWL LRG LVL3 (GOWN DISPOSABLE) ×6 IMPLANT
GOWN STRL REUS W/TWL XL LVL3 (GOWN DISPOSABLE) ×3
HEMOSTAT ARISTA ABSORB 3G PWDR (MISCELLANEOUS) ×2 IMPLANT
HEMOSTAT SNOW SURGICEL 2X4 (HEMOSTASIS) ×3 IMPLANT
INST SET LAPROSCOPIC AP (KITS) ×3 IMPLANT
IV NS IRRIG 3000ML ARTHROMATIC (IV SOLUTION) IMPLANT
KIT ROOM TURNOVER APOR (KITS) ×3 IMPLANT
MANIFOLD NEPTUNE II (INSTRUMENTS) ×3 IMPLANT
NDL INSUFFLATION 14GA 120MM (NEEDLE) ×1 IMPLANT
NEEDLE INSUFFLATION 14GA 120MM (NEEDLE) ×3 IMPLANT
NS IRRIG 1000ML POUR BTL (IV SOLUTION) ×3 IMPLANT
PACK LAP CHOLE LZT030E (CUSTOM PROCEDURE TRAY) ×3 IMPLANT
PAD ARMBOARD 7.5X6 YLW CONV (MISCELLANEOUS) ×3 IMPLANT
POUCH SPECIMEN RETRIEVAL 10MM (ENDOMECHANICALS) ×3 IMPLANT
SET BASIN LINEN APH (SET/KITS/TRAYS/PACK) ×3 IMPLANT
SET TUBE IRRIG SUCTION NO TIP (IRRIGATION / IRRIGATOR) IMPLANT
SLEEVE ENDOPATH XCEL 5M (ENDOMECHANICALS) ×6 IMPLANT
SUT MNCRL AB 4-0 PS2 18 (SUTURE) ×2 IMPLANT
SUT VIC AB 4-0 PS2 27 (SUTURE) ×1 IMPLANT
SUT VICRYL 0 UR6 27IN ABS (SUTURE) ×3 IMPLANT
SUT VICRYL AB 3-0 FS1 BRD 27IN (SUTURE) ×3 IMPLANT
TROCAR ENDO BLADELESS 11MM (ENDOMECHANICALS) ×3 IMPLANT
TROCAR XCEL NON-BLD 5MMX100MML (ENDOMECHANICALS) ×3 IMPLANT
TUBE CONNECTING 12'X1/4 (SUCTIONS)
TUBE CONNECTING 12X1/4 (SUCTIONS) IMPLANT
TUBING INSUFFLATION (TUBING) ×3 IMPLANT
WARMER LAPAROSCOPE (MISCELLANEOUS) ×3 IMPLANT

## 2016-07-14 NOTE — Transfer of Care (Signed)
Immediate Anesthesia Transfer of Care Note  Patient: Ralph Martinez  Procedure(s) Performed: Procedure(s): LAPAROSCOPIC CHOLECYSTECTOMY (N/A)  Patient Location: PACU  Anesthesia Type:General  Level of Consciousness: awake  Airway & Oxygen Therapy: Patient Spontanous Breathing and Patient connected to face mask oxygen  Post-op Assessment: Report given to RN  Post vital signs: Reviewed and stable  Last Vitals:  Vitals:   07/14/16 0900 07/14/16 0905  BP: 132/82 127/79  Pulse:    Resp: 16 19  Temp:      Last Pain:  Vitals:   07/14/16 0809  TempSrc: Oral  PainSc: 3       Patients Stated Pain Goal: 5 ([id number])  Complications: No apparent anesthesia complications

## 2016-07-14 NOTE — Progress Notes (Signed)
Instructions given on discharge medications and follow up visits,patient and family verbalized understanding. Prescriptions sent with patient. Accompanied by staff to an awaiting vehicle.

## 2016-07-14 NOTE — Anesthesia Procedure Notes (Signed)
Procedure Name: Intubation Date/Time: 07/14/2016 9:17 AM Performed by: Tressie Stalker E Pre-anesthesia Checklist: Patient identified, Patient being monitored, Timeout performed, Emergency Drugs available and Suction available Patient Re-evaluated:Patient Re-evaluated prior to inductionOxygen Delivery Method: Circle system utilized Preoxygenation: Pre-oxygenation with 100% oxygen Intubation Type: IV induction Ventilation: Mask ventilation without difficulty Laryngoscope Size: Mac and 3 Grade View: Grade I Tube type: Oral Tube size: 7.0 mm Number of attempts: 1 Airway Equipment and Method: Stylet Placement Confirmation: ETT inserted through vocal cords under direct vision,  positive ETCO2 and breath sounds checked- equal and bilateral Secured at: 21 cm Tube secured with: Tape Dental Injury: Teeth and Oropharynx as per pre-operative assessment

## 2016-07-14 NOTE — Anesthesia Preprocedure Evaluation (Signed)
Anesthesia Evaluation  Patient identified by MRN, date of birth, ID band Patient awake    Reviewed: Allergy & Precautions, NPO status , Patient's Chart, lab work & pertinent test results  Airway Mallampati: I  TM Distance: >3 FB     Dental  (+) Teeth Intact, Dental Advisory Given   Pulmonary former smoker,    breath sounds clear to auscultation       Cardiovascular hypertension, Pt. on medications  Rhythm:Regular Rate:Normal     Neuro/Psych    GI/Hepatic Nausea this am    Endo/Other    Renal/GU Renal disease (stones)     Musculoskeletal   Abdominal   Peds  Hematology   Anesthesia Other Findings   Reproductive/Obstetrics                             Anesthesia Physical Anesthesia Plan  ASA: II  Anesthesia Plan: General   Post-op Pain Management:    Induction: Intravenous, Rapid sequence and Cricoid pressure planned  Airway Management Planned: Oral ETT  Additional Equipment:   Intra-op Plan:   Post-operative Plan: Extubation in OR  Informed Consent: I have reviewed the patients History and Physical, chart, labs and discussed the procedure including the risks, benefits and alternatives for the proposed anesthesia with the patient or authorized representative who has indicated his/her understanding and acceptance.     Plan Discussed with:   Anesthesia Plan Comments:         Anesthesia Quick Evaluation

## 2016-07-14 NOTE — Anesthesia Postprocedure Evaluation (Signed)
Anesthesia Post Note  Patient: Ralph Martinez  Procedure(s) Performed: Procedure(s) (LRB): LAPAROSCOPIC CHOLECYSTECTOMY (N/A)  Patient location during evaluation: PACU Anesthesia Type: MAC Level of consciousness: awake and alert Pain management: pain level controlled Vital Signs Assessment: post-procedure vital signs reviewed and stable Respiratory status: spontaneous breathing Cardiovascular status: blood pressure returned to baseline Postop Assessment: no signs of nausea or vomiting Anesthetic complications: no    Last Vitals:  Vitals:   07/14/16 0905 07/14/16 1050  BP: 127/79 126/72  Pulse:  79  Resp: 19 19  Temp:  37.3 C    Last Pain:  Vitals:   07/14/16 1050  TempSrc:   PainSc: Asleep                 Deandre Stansel

## 2016-07-14 NOTE — Discharge Summary (Signed)
Physician Discharge Summary  Patient ID: Ralph Martinez MRN: [id number] DOB/AGE: 01-16-1951 65 y.o.  Admit date: 07/13/2016 Discharge date: 07/14/2016  Admission Diagnoses:  Discharge Diagnoses:  Active Problems:   Acute cholecystitis   Discharged Condition: good  Hospital Course: 65 year old Male presented with acute onset of post-prandial RUQ abdominal pain x 2.5 days. CT demonstrated dilated gallbladder with wall thickening and pericholecystic fluid as well as thickening/inflammation of adjacent colonic hepatic flexure and normal diameter of CBD. Though pain initially persisted despite narcotic pain medication, the pain entirely resolved after administration of a single dose of Toradol. Patient underwent laparoscopic cholecystectomy, and patient's post-operative course was uneventful with only mild epigastric peri-incisional pain. Patient was otherwise able to tolerate PO, ambulated, passed flatus/BM without difficulty, and discharge was requested by patient and planned accordingly with appropriate discharge instructions. Due to visualization of pus draining from gallbladder during intra-operative gallbladder decompression, patient will complete a 5 day course of PO antibiotics.  Consults: None  Significant Diagnostic Studies: radiology: CT scan: Abdomen Pelvis - Acute cholecystitis with inflammation extending to colonic hepatic flexure  Treatments: IV hydration, antibiotics: Zosyn and surgery: laparoscopic cholecystectomy  Discharge Exam: Blood pressure 113/68, pulse 82, temperature 99 F (37.2 C), temperature source Oral, resp. rate 16, height 5\' 9"  (1.753 m), weight 83.9 kg (185 lb), SpO2 96 %. General appearance: alert, cooperative and no distress  Disposition: 01-Home or Self Care     Medication List    TAKE these medications   ciprofloxacin 500 MG tablet Commonly known as:  CIPRO Take 1 tablet (500 mg total) by mouth 2 (two) times daily.   losartan 50 MG tablet Commonly  known as:  COZAAR 1 tablet daily.   metroNIDAZOLE 500 MG tablet Commonly known as:  FLAGYL Take 1 tablet (500 mg total) by mouth 3 (three) times daily.   polyvinyl alcohol 1.4 % ophthalmic solution Commonly known as:  LIQUIFILM TEARS Place 2 drops into both eyes as needed for dry eyes.   pravastatin 20 MG tablet Commonly known as:  PRAVACHOL 1 tablet daily.   traMADol-acetaminophen 37.5-325 MG tablet Commonly known as:  ULTRACET Take 1-2 tablets by mouth every 4 (four) hours as needed for severe pain.      Follow-up Information    Vickie Epley, MD. Schedule an appointment as soon as possible for a visit in 2 week(s).   Specialty:  General Surgery Contact information: [facility] [address] [phone]           Signed: Vickie Epley 07/14/2016, 5:07 PM

## 2016-07-14 NOTE — Op Note (Signed)
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 male who this admission presented with worsening generalized abdominal pain 2 days prior to presentation after eating breakfast the day after Thanksgiving. He denied any abdominal pain on or prior to Thanksgiving, and the pain then worsened and became more focused over his Right side with development of nausea and vomited. CT suggested acute calculous cholecystitis, and patient's pain was controlled with a dose of Toradol. All risks, benefits, and alternatives to above elective procedures were discussed with the patient, who elected to proceed, and informed consent was accordingly obtained at that time.   DETAILS OF PROCEDURE:  Patient was brought to the operating suite and appropriately identified. General anesthesia was administered along with peri-operative prophylactic IV antibiotics, and endotracheal intubation was performed by anesthesiologist, along with NG/OG tube for gastric decompression. In supine position, operative site was prepped and draped in usual sterile fashion,  and following a brief time out, initial 5 mm incision was made in a natural skin crease just above the umbilicus. Fascia was then elevated, and a Verress needle was inserted and its proper position confirmed using aspiration and saline meniscus test.  Upon insufflation of the abdominal cavity with carbon dioxide to a well-tolerated pressure of 12-15 mmHg, 5 mm peri-umbilical port followed by laparoscope were inserted and used to inspect the abdominal cavity and its contents with no injuries from insertion of the first trochar noted. Three additional trocars were inserted, one at the epigastric position (10 mm) and two along the Right costal margin (5 mm). The table was then placed in reverse Trendelenburg position with the Right side up. Filmy adhesions between the gallbladder and omentum/duodenum/transverse colon were lysed using combined blunt and sharp dissection. The apex/dome of the gallbladder was grasped with an atraumatic grasper passed through the lateral port and retracted apically over the liver. The infundibulum was also grasped and retracted, exposing Calot's triangle. The peritoneum overlying the gallbladder infundibulum was incised and dissected free of surrounding peritoneal attachments, revealing the cystic duct and cystic artery, which were clipped twice on the patient side and once on the gallbladder specimen side close to the gallbladder. The gallbladder was then dissected from its peritoneal attachments to the liver using electrocautery, and the gallbladder was placed into a laparoscopic specimen bag and removed from the abdominal cavity via the epigastric port site. Hemostasis and secure placement of clips were confirmed, and intra-peritoneal cavity was inspected with no additional findings. Endoclose laparoscopic fascial closure device was then used to re-approximate fascia at the 10 mm epigastric port site.  All ports were then removed under direct visualization, and abdominal cavity was  desuflated. All port sites were irrigated/cleaned, additional local anesthetic was injected at each incision, 3-0 Vicryl was used to re-approximate dermis at 10 mm port  site(s), and subcuticular 4-0 Monocryl suture was used to re-approximate skin. Skin was then cleaned, dried, and sterile skin glue was applied. Patient was then safely able to be awakened, extubated, and transferred to PACU for post-operative monitoring and care.   I was present for all aspects of procedure, and there were no intra-operative complications apparent.

## 2016-07-17 ENCOUNTER — Encounter (HOSPITAL_COMMUNITY): Payer: Self-pay | Admitting: Surgery

## 2016-07-22 ENCOUNTER — Other Ambulatory Visit (HOSPITAL_COMMUNITY): Payer: Self-pay | Admitting: Surgery

## 2016-07-22 DIAGNOSIS — I83893 Varicose veins of bilateral lower extremities with other complications: Secondary | ICD-10-CM

## 2016-08-25 ENCOUNTER — Ambulatory Visit (HOSPITAL_COMMUNITY)
Admission: RE | Admit: 2016-08-25 | Discharge: 2016-08-25 | Disposition: A | Discharge status: Home / Self Care | Payer: BLUE CROSS/BLUE SHIELD | Source: Ambulatory Visit | Attending: Surgery | Admitting: Surgery

## 2016-08-25 DIAGNOSIS — I83893 Varicose veins of bilateral lower extremities with other complications: Secondary | ICD-10-CM | POA: Insufficient documentation

## 2018-07-20 ENCOUNTER — Encounter (INDEPENDENT_AMBULATORY_CARE_PROVIDER_SITE_OTHER): Payer: Self-pay | Admitting: *Deleted

## 2018-12-01 ENCOUNTER — Inpatient Hospital Stay (HOSPITAL_COMMUNITY)
Admission: EM | Admit: 2018-12-01 | Discharge: 2018-12-02 | DRG: 062 | Disposition: A | Discharge status: Home / Self Care | Payer: Medicare HMO | Attending: Neurology | Admitting: Neurology

## 2018-12-01 ENCOUNTER — Encounter (HOSPITAL_COMMUNITY): Payer: Self-pay | Admitting: Emergency Medicine

## 2018-12-01 ENCOUNTER — Emergency Department (HOSPITAL_COMMUNITY): Payer: Medicare HMO

## 2018-12-01 ENCOUNTER — Encounter (HOSPITAL_COMMUNITY): Payer: Medicare HMO

## 2018-12-01 ENCOUNTER — Other Ambulatory Visit: Payer: Self-pay

## 2018-12-01 ENCOUNTER — Inpatient Hospital Stay (HOSPITAL_COMMUNITY): Payer: Medicare HMO

## 2018-12-01 DIAGNOSIS — I6381 Other cerebral infarction due to occlusion or stenosis of small artery: Principal | ICD-10-CM | POA: Diagnosis present

## 2018-12-01 DIAGNOSIS — I639 Cerebral infarction, unspecified: Secondary | ICD-10-CM

## 2018-12-01 DIAGNOSIS — E785 Hyperlipidemia, unspecified: Secondary | ICD-10-CM | POA: Diagnosis not present

## 2018-12-01 DIAGNOSIS — E7800 Pure hypercholesterolemia, unspecified: Secondary | ICD-10-CM | POA: Diagnosis not present

## 2018-12-01 DIAGNOSIS — Z87891 Personal history of nicotine dependence: Secondary | ICD-10-CM | POA: Diagnosis not present

## 2018-12-01 DIAGNOSIS — R29704 NIHSS score 4: Secondary | ICD-10-CM | POA: Diagnosis present

## 2018-12-01 DIAGNOSIS — Z881 Allergy status to other antibiotic agents status: Secondary | ICD-10-CM

## 2018-12-01 DIAGNOSIS — G510 Bell's palsy: Secondary | ICD-10-CM | POA: Diagnosis present

## 2018-12-01 DIAGNOSIS — Z885 Allergy status to narcotic agent status: Secondary | ICD-10-CM

## 2018-12-01 DIAGNOSIS — I6300 Cerebral infarction due to thrombosis of unspecified precerebral artery: Secondary | ICD-10-CM | POA: Diagnosis not present

## 2018-12-01 DIAGNOSIS — N289 Disorder of kidney and ureter, unspecified: Secondary | ICD-10-CM

## 2018-12-01 DIAGNOSIS — G8194 Hemiplegia, unspecified affecting left nondominant side: Secondary | ICD-10-CM | POA: Diagnosis not present

## 2018-12-01 DIAGNOSIS — Z9049 Acquired absence of other specified parts of digestive tract: Secondary | ICD-10-CM

## 2018-12-01 DIAGNOSIS — Z8249 Family history of ischemic heart disease and other diseases of the circulatory system: Secondary | ICD-10-CM

## 2018-12-01 DIAGNOSIS — I10 Essential (primary) hypertension: Secondary | ICD-10-CM | POA: Diagnosis not present

## 2018-12-01 HISTORY — DX: Cerebral infarction, unspecified: I63.9

## 2018-12-01 LAB — COMPREHENSIVE METABOLIC PANEL
ALT: 19 U/L (ref 0–44)
AST: 21 U/L (ref 15–41)
Albumin: 4.3 g/dL (ref 3.5–5.0)
Alkaline Phosphatase: 67 U/L (ref 38–126)
Anion gap: 9 (ref 5–15)
BUN: 15 mg/dL (ref 8–23)
CO2: 27 mmol/L (ref 22–32)
Calcium: 9.2 mg/dL (ref 8.9–10.3)
Chloride: 102 mmol/L (ref 98–111)
Creatinine, Ser: 1.26 mg/dL — ABNORMAL HIGH (ref 0.61–1.24)
GFR calc Af Amer: >60 mL/min (ref >60)
GFR calc non Af Amer: 59 mL/min — ABNORMAL LOW (ref >60)
Glucose, Bld: 159 mg/dL — ABNORMAL HIGH (ref 70–99)
Potassium: 3.9 mmol/L (ref 3.5–5.1)
Sodium: 138 mmol/L (ref 135–145)
Total Bilirubin: 1.2 mg/dL (ref 0.3–1.2)
Total Protein: 7.2 g/dL (ref 6.5–8.1)

## 2018-12-01 LAB — DIFFERENTIAL
Abs Immature Granulocytes: 0.02 10*3/uL (ref 0.00–0.07)
Basophils Absolute: 0.1 10*3/uL (ref 0.0–0.1)
Basophils Relative: 1 %
Eosinophils Absolute: 0.2 10*3/uL (ref 0.0–0.5)
Eosinophils Relative: 3 %
Immature Granulocytes: 0 %
Lymphocytes Relative: 28 %
Lymphs Abs: 1.8 10*3/uL (ref 0.7–4.0)
Monocytes Absolute: 0.6 10*3/uL (ref 0.1–1.0)
Monocytes Relative: 10 %
Neutro Abs: 3.7 10*3/uL (ref 1.7–7.7)
Neutrophils Relative %: 58 %

## 2018-12-01 LAB — RAPID URINE DRUG SCREEN, HOSP PERFORMED
Amphetamines: NOT DETECTED
Barbiturates: NOT DETECTED
Benzodiazepines: NOT DETECTED
Cocaine: NOT DETECTED
Opiates: NOT DETECTED
Tetrahydrocannabinol: NOT DETECTED

## 2018-12-01 LAB — CBC
HCT: 42.6 % (ref 39.0–52.0)
Hemoglobin: 14.2 g/dL (ref 13.0–17.0)
MCH: 30.3 pg (ref 26.0–34.0)
MCHC: 33.3 g/dL (ref 30.0–36.0)
MCV: 90.8 fL (ref 80.0–100.0)
Platelets: 203 10*3/uL (ref 150–400)
RBC: 4.69 MIL/uL (ref 4.22–5.81)
RDW: 13.5 % (ref 11.5–15.5)
WBC: 6.4 10*3/uL (ref 4.0–10.5)
nRBC: 0 % (ref 0.0–0.2)

## 2018-12-01 LAB — URINALYSIS, ROUTINE W REFLEX MICROSCOPIC
Bacteria, UA: NONE SEEN
Bilirubin Urine: NEGATIVE
Glucose, UA: NEGATIVE mg/dL
Hgb urine dipstick: NEGATIVE
Ketones, ur: NEGATIVE mg/dL
Nitrite: NEGATIVE
Protein, ur: NEGATIVE mg/dL
Specific Gravity, Urine: 1.003 — ABNORMAL LOW (ref 1.005–1.030)
pH: 6 (ref 5.0–8.0)

## 2018-12-01 LAB — VITAMIN B12: Vitamin B-12: 300 pg/mL (ref 180–914)

## 2018-12-01 LAB — MRSA PCR SCREENING: MRSA by PCR: NEGATIVE

## 2018-12-01 LAB — TSH: TSH: 4.316 u[IU]/mL (ref 0.350–4.500)

## 2018-12-01 LAB — I-STAT CREATININE, ED: Creatinine, Ser: 1.3 mg/dL — ABNORMAL HIGH (ref 0.61–1.24)

## 2018-12-01 LAB — PROTIME-INR
INR: 1 (ref 0.8–1.2)
Prothrombin Time: 13.2 seconds (ref 11.4–15.2)

## 2018-12-01 LAB — CBG MONITORING, ED: Glucose-Capillary: 153 mg/dL — ABNORMAL HIGH (ref 70–99)

## 2018-12-01 LAB — APTT: aPTT: 29 seconds (ref 24–36)

## 2018-12-01 LAB — ECHOCARDIOGRAM LIMITED
Height: 72 in
Weight: 3280 oz

## 2018-12-01 LAB — ETHANOL: Alcohol, Ethyl (B): <10 mg/dL (ref <10)

## 2018-12-01 MED ORDER — NICARDIPINE HCL IN NACL 20-0.86 MG/200ML-% IV SOLN: 0.0000 mg/h | INTRAVENOUS | Status: DC | PRN

## 2018-12-01 MED ORDER — LABETALOL HCL 5 MG/ML IV SOLN: 10.0000 mg | Freq: Once | INTRAVENOUS | Status: DC | PRN

## 2018-12-01 MED ORDER — ALTEPLASE (STROKE) FULL DOSE INFUSION
0.9000 mg/kg | Freq: Once | INTRAVENOUS | Status: AC
  Administered 2018-12-01: 10:00:00 83.7 mg via INTRAVENOUS
  Filled 2018-12-01: qty 100

## 2018-12-01 MED ORDER — PRAVASTATIN SODIUM 10 MG PO TABS
20.0000 mg | ORAL_TABLET | Freq: Every day | ORAL | Status: DC
  Administered 2018-12-01: 20 mg via ORAL
  Filled 2018-12-01: qty 2

## 2018-12-01 MED ORDER — ACETAMINOPHEN 160 MG/5ML PO SOLN: 650.0000 mg | ORAL | Status: DC | PRN

## 2018-12-01 MED ORDER — ALTEPLASE 100 MG IV SOLR
INTRAVENOUS | Status: AC
  Administered 2018-12-01: 83.7 mg via INTRAVENOUS
  Filled 2018-12-01: qty 100

## 2018-12-01 MED ORDER — STROKE: EARLY STAGES OF RECOVERY BOOK
Freq: Once | Status: AC
  Administered 2018-12-01: 16:00:00

## 2018-12-01 MED ORDER — ACETAMINOPHEN 650 MG RE SUPP: 650.0000 mg | RECTAL | Status: DC | PRN

## 2018-12-01 MED ORDER — ACETAMINOPHEN 325 MG PO TABS: 650.0000 mg | ORAL_TABLET | ORAL | Status: DC | PRN

## 2018-12-01 MED ORDER — PANTOPRAZOLE SODIUM 40 MG IV SOLR
40.0000 mg | Freq: Every day | INTRAVENOUS | Status: DC
  Administered 2018-12-01: 40 mg via INTRAVENOUS
  Filled 2018-12-01 (×2): qty 40

## 2018-12-01 MED ORDER — LOSARTAN POTASSIUM 50 MG PO TABS
50.0000 mg | ORAL_TABLET | Freq: Every day | ORAL | Status: DC
  Administered 2018-12-01 – 2018-12-02 (×2): 50 mg via ORAL
  Filled 2018-12-01 (×2): qty 1

## 2018-12-01 MED ORDER — IRBESARTAN 150 MG PO TABS: 300.0000 mg | ORAL_TABLET | Freq: Every day | ORAL | Status: DC

## 2018-12-01 MED ORDER — SODIUM CHLORIDE 0.9 % IV SOLN: 50.0000 mL | Freq: Once | INTRAVENOUS | Status: DC

## 2018-12-01 MED ORDER — SODIUM CHLORIDE 0.9 % IV SOLN
50.0000 mL/h | INTRAVENOUS | Status: DC
  Administered 2018-12-01: 50 mL/h via INTRAVENOUS

## 2018-12-01 MED ORDER — POLYVINYL ALCOHOL 1.4 % OP SOLN
2.0000 [drp] | OPHTHALMIC | Status: DC | PRN
  Filled 2018-12-01: qty 15

## 2018-12-01 NOTE — Progress Notes (Signed)
CODE STROKE CT Times 2575 call time 0518 exam started  0859 exam finished 0859 images sent to telemed 0903 exam complete in EPIC [place] called

## 2018-12-01 NOTE — ED Notes (Signed)
Teleneuro cart activated at this time and speaking with Chryl Heck. CCRN,RN. Awaiting neurologist arrival.

## 2018-12-01 NOTE — ED Notes (Signed)
Teleneurologist assessment in process.

## 2018-12-01 NOTE — Consult Note (Signed)
[facility] TeleNeurology Consult Services   Date of Service:   12/01/2018 09:07:21  Impression:     .  Right Hemispheric Infarct  Comments/Sign-Out: Symmetric mild left sided weakness without cortical signs. Suspect small vessel disease. Recommend admission for post tpa care and work up. Risks and benefits of IV tpa were discussed and patient agreed to proceed with treatment.  Mechanism of Stroke: Small Vessel Disease  Metrics: Last Known Well: 12/01/2018 07:15:00 [facility] Notification Time: 12/01/2018 09:07:21 Arrival Time: 12/01/2018 08:47:00 Stamp Time: 12/01/2018 09:07:21 Time First Login Attempt: 12/01/2018 09:13:10 Video Start Time: 12/01/2018 09:13:10  Symptoms: left sided numbness NIHSS Start Assessment Time: 12/01/2018 09:16:20 tPA Verbal Order Time: 12/01/2018 78:29:56 Patient is a candidate for tPA. tPA CPOE Order Time: 12/01/2018 09:26:42 Needle Time: 12/01/2018 09:32:15 Weight Noted by Staff: 92.9 kg Video End Time: 12/01/2018 09:33:21  CT head showed no acute hemorrhage or acute core infarct.  Clinical Presentation is not Suggestive of Large Vessel Occlusive Disease  ED Physician notified of diagnostic impression and management plan on 12/01/2018 09:34:06  Verbal Consent to tPA: I have explained to the Patient the nature of the patient's condition, the use of tPA fibrinolytic agent, and the benefits to be reasonably expected compared with alternative approaches. I have discussed the likelihood of major risks or complications of this procedure including (if applicable) but not limited to loss of limb function, brain damage, paralysis, hemorrhage, infection, complications from transfusion of blood components, drug reactions, blood clots and loss of life. I have also indicated that with any procedure there is always the possibility of an unexpected complication. All questions were answered and Patient express understanding of the  treatment plan and consent to the treatment.  Our recommendations are outlined below.  Recommendations: IV tPA recommended.  tPA bolus given Without Complication.   IV tPA Total Dose - 83.7 mg IV tPA Bolus Dose - 8.4 mg IV tPA Infusion Dose - 75.3 mg  Routine post tPA monitoring including neuro checks and blood pressure control during/after treatment Monitor blood pressure Check blood pressure and NIHSS every 15 min for 2 h, then every 30 min for 6 h, and finally every hour for 16 h.  Manage Blood Pressure per post tPA protocol.      .  Admission to ICU     .  CT brain 24 hours post tPA     .  NPO until swallowing screen performed and passed     .  No antiplatelet agents or anticoagulants (including heparin for DVT prophylaxis) in first 24 hours     .  No Foley catheter, nasogastric tube, arterial catheter or central venous catheter for 24 hr, unless absolutely necessary     .  Telemetry     .  Bedside swallow evaluation     .  HOB less than 30 degrees     .  Euglycemia     .  Avoid hyperthermia, PRN acetaminophen     .  DVT prophylaxis     .  Inpatient Neurology Consultation     .  Stroke evaluation as per inpatient neurology recommendations  Discussed with ED physician    ------------------------------------------------------------------------------  History of Present Illness: Patient is a 68 year old Male.  Patient was brought by private transportation with symptoms of left sided numbness  67 yo M with history of htn and hl who is presenting with left sided numbness. Patient woke up this morning normal and at 7:15 got left leg numbness that traveled  up his left side. He did not notice any weakness. He feels that his symptoms have improved but still some mild numbness on the left side of his head. No headache.  CT head showed no acute hemorrhage or acute core infarct.   Examination: BP(136/84), Blood Glucose(159) 1A: Level of Consciousness - Alert; keenly  responsive + 0 1B: Ask Month and Age - Both Questions Right + 0 1C: Blink Eyes & Squeeze Hands - Performs Both Tasks + 0 2: Test Horizontal Extraocular Movements - Normal + 0 3: Test Visual Fields - No Visual Loss + 0 4: Test Facial Palsy (Use Grimace if Obtunded) - Minor paralysis (flat nasolabial fold, smile asymmetry) + 1 5A: Test Left Arm Motor Drift - Drift, but doesn't hit bed + 1 5B: Test Right Arm Motor Drift - No Drift for 10 Seconds + 0 6A: Test Left Leg Motor Drift - Drift, but doesn't hit bed + 1 6B: Test Right Leg Motor Drift - No Drift for 5 Seconds + 0 7: Test Limb Ataxia (FNF/Heel-Shin) - No Ataxia + 0 8: Test Sensation - Mild-Moderate Loss: Less Sharp/More Dull + 1 9: Test Language/Aphasia - Normal; No aphasia + 0 10: Test Dysarthria - Normal + 0 11: Test Extinction/Inattention - No abnormality + 0  NIHSS Score: 4  Patient was informed the Neurology Consult would happen via TeleHealth consult by way of interactive audio and video telecommunications and consented to receiving care in this manner.  Due to the immediate potential for life-threatening deterioration due to underlying acute neurologic illness, I spent 35 minutes providing critical care. This time includes time for face to face visit via telemedicine, review of medical records, imaging studies and discussion of findings with providers, the patient and/or family.   Dr Carolin Sicks   [facility] [phone]   Case [id number]

## 2018-12-01 NOTE — ED Notes (Signed)
Pt left with [facility] 

## 2018-12-01 NOTE — ED Notes (Signed)
Labs walked over to lab by [facility] at this time.

## 2018-12-01 NOTE — ED Notes (Signed)
PT returned to ED room at this time. Still awaiting neurologist arrival.

## 2018-12-01 NOTE — Progress Notes (Signed)
  Echocardiogram 2D Echocardiogram has been performed.  Ralph Martinez 12/01/2018, 1:47 PM

## 2018-12-01 NOTE — Evaluation (Addendum)
Speech Language Pathology Evaluation Patient Details Name: Ralph Martinez MRN: [id number] DOB: 1950/11/20 Today's Date: 12/01/2018 Time: 1410-1436 SLP Time Calculation (min) (ACUTE ONLY): 26 min  Problem List:  Patient Active Problem List   Diagnosis Date Noted  . CVA (cerebral vascular accident) ([place]) 12/01/2018  . Stroke (cerebrum) ([place]) 12/01/2018  . Acute cholecystitis 07/13/2016   Past Medical History:  Past Medical History:  Diagnosis Date  . High cholesterol   . Hypertension   . Kidney stones    Past Surgical History:  Past Surgical History:  Procedure Laterality Date  . CHOLECYSTECTOMY N/A 07/14/2016   Procedure: LAPAROSCOPIC CHOLECYSTECTOMY;  Surgeon: Vickie Epley, MD;  Location: [facility];  Service: General;  Laterality: N/A;  . KIDNEY STONE SURGERY     HPI:  Pt is a 68 y.o. male with PMH of HTN and HLD who presented to the emergency department for evaluation of left side numbness.  Patient woke on 12/01/18 at 6 AM without symptoms but at approximately 7:15 AM he developed numbness worse in the left leg which then proceeded to involve his left arm and left face.  He stated he was feeling somewhat "fuzzy" in the head but denied headache. IV tPA was administered. CT of the head revealed chronic small-vessel ischemic changes of the hemispheric white matter, progressive since 2005 but was negative for acute changes.    Assessment / Plan / Recommendation Clinical Impression  Pt participated in speech/language/cognition evaluation and he denied any baseline deficits in these areas.  The [facility] Cognitive Assessment 8.1 was completed to evaluate the pt's cognitive-linguistic skills. He achieved a score of 17/30 which is below the normal limits of 26 or more out of 30 and is suggestive of a moderate impairment. He demonstrated deficits in the areas of executive function, attention, mental manipulation, divergent naming, abstract reasoning, and memory. It is noteworthy that the pt was  very talkative throughout this assessment and the impact of this on his performance is considered. Skilled SLP services will be initiated at this time to improve cognition but may be subsequently discontinued if they do not appear to not be clinically indicated based on his performance during subsequent sessions. Pt, and nursing were educated regarding results and recommendations; both parties verbalized understanding as well as agreement with plan of care.    SLP Assessment  SLP Recommendation/Assessment: Patient needs continued Speech Lanaguage Pathology Services SLP Visit Diagnosis: Cognitive communication deficit (R41.841)    Follow Up Recommendations  Other (comment)(Continued SLP services at level of care rx by PT/OT)    Frequency and Duration min 2x/week  2 weeks      SLP Evaluation Cognition  Overall Cognitive Status: Impaired/Different from baseline Arousal/Alertness: Awake/alert Orientation Level: Oriented to person;Oriented to place;Disoriented to time(Disoriented to date; oriented to month, year, day) Attention: Focused;Sustained Focused Attention: Appears intact(Vigilance WNL: 1/1) Sustained Attention: Impaired Sustained Attention Impairment: Verbal complex(Serial 7s: 2/3) Memory: Impaired Memory Impairment: Retrieval deficit;Decreased recall of new information(Immediate: 4/5; Delayed: 0/5; Category cues: 2/5; Choice:2/3) Awareness: Impaired Awareness Impairment: Intellectual impairment Problem Solving: Appears intact Executive Function: Reasoning;Sequencing Reasoning: Impaired Reasoning Impairment: Verbal complex(Abstraction: 0/2) Sequencing: Impaired Sequencing Impairment: Verbal complex(Difficulty drawing clock: 2/3)       Comprehension  Auditory Comprehension Overall Auditory Comprehension: Appears within functional limits for tasks assessed Yes/No Questions: Within Functional Limits Commands: Impaired Complex Commands: (Trail completion: 0/1) Conversation:  Complex Reading Comprehension Reading Status: Not tested    Expression Expression Primary Mode of Expression: Verbal Verbal Expression Overall Verbal Expression: Appears  within functional limits for tasks assessed Initiation: No impairment Level of Generative/Spontaneous Verbalization: Sentence Repetition: No impairment(Sentence repetition: 2/2) Naming: Impairment Responsive: Not tested Confrontation: Within functional limits(3/3) Convergent: Not tested Divergent: (0/1)   Oral / Motor  Oral Motor/Sensory Function Overall Oral Motor/Sensory Function: Within functional limits Motor Speech Overall Motor Speech: Appears within functional limits for tasks assessed Respiration: Within functional limits Phonation: Normal Resonance: Within functional limits Articulation: Within functional limitis Intelligibility: Intelligible Motor Planning: Witnin functional limits Motor Speech Errors: Not applicable   Kimberlee Shoun I. Hardin Negus, [place], [place] Office number [phone] Pager [place] 12/01/2018, 2:46 PM

## 2018-12-01 NOTE — H&P (Addendum)
Neurology history and physical   CC: Left-sided tingling and weakness  History is obtained from: Patient  HPI: Ralph Martinez is a 68 y.o. male with history of kidney stones, hypertension, hypercholesterolemia.  Patient states that he got up around 6 AM this morning without symptoms.  He was making a pot of coffee and took his blood pressure medications.  At approximately 7:00 he developed numbness that went from his leg up to his arm which then proceeded to include his face with significant left leg weakness.  He denies any other symptoms.  He did call EMS which [facility]  Patient was administered TPA at that point and sent to [facility] for further monitoring.  Upon arrival patient had significantly improved.  The only findings were a left facial droop.  Patient feels as though he is back to normal.  He denies taking aspirin on a daily basis.   LKW: 715 this morning tpa given?:  Yes Premorbid modified Rankin scale (mRS): 0 NIH stroke score: 1   ROS: A 14 point ROS was performed and is negative except as noted in the HPI.   Past Medical History:  Diagnosis Date  . High cholesterol   . Hypertension   . Kidney stones      Family History  Problem Relation Age of Onset  . Hypertension Mother   . Hypertension Father     Social History:   reports that he quit smoking about 40 years ago. His smoking use included cigarettes. He smoked 0.08 packs per day. He has quit using smokeless tobacco. He reports current alcohol use. He reports that he does not use drugs.  Medications  Current Facility-Administered Medications:  .   stroke: mapping our early stages of recovery book, , Does not apply, Once, Greta Doom, MD .  [COMPLETED] alteplase (ACTIVASE) 1 mg/mL infusion 83.7 mg, 0.9 mg/kg, Intravenous, Once, Last Rate: 83.7 mL/hr at 12/01/18 0932, 83.7 mg at 12/01/18 0932 **FOLLOWED BY** 0.9 %  sodium chloride infusion, 50 mL, Intravenous, Once, Sevilis, Theresa  Beth, DO .  0.9 %  sodium chloride infusion, 50 mL/hr, Intravenous, Continuous, Leonel Ramsay, Vida Roller, MD .  acetaminophen (TYLENOL) tablet 650 mg, 650 mg, Oral, Q4H PRN **OR** acetaminophen (TYLENOL) solution 650 mg, 650 mg, Per Tube, Q4H PRN **OR** acetaminophen (TYLENOL) suppository 650 mg, 650 mg, Rectal, Q4H PRN, Greta Doom, MD .  labetalol (NORMODYNE,TRANDATE) injection 10 mg, 10 mg, Intravenous, Once PRN **AND** nicardipine (CARDENE) 20mg  in 0.86% saline 247ml IV infusion (0.1 mg/ml), 0-15 mg/hr, Intravenous, Continuous PRN, Greta Doom, MD .  pantoprazole (PROTONIX) injection 40 mg, 40 mg, Intravenous, QHS, Greta Doom, MD   Exam: Current vital signs: BP 140/85   Pulse (!) 58   Temp 98.3 F (36.8 C) (Oral)   Resp (!) 25   Ht 6' (1.829 m)   Wt 93 kg   SpO2 100%   BMI 27.80 kg/m  Vital signs in last 24 hours: Temp:  [98.3 F (36.8 C)] 98.3 F (36.8 C) (04/15 1105) Pulse Rate:  [53-61] 58 (04/15 1225) Resp:  [11-25] 25 (04/15 1225) BP: (118-175)/(72-106) 140/85 (04/15 1225) SpO2:  [98 %-100 %] 100 % (04/15 1225) Weight:  [93 kg] 93 kg (04/15 0854)  Physical Exam  Constitutional: Appears well-developed and well-nourished.  Psych: Affect appropriate to situation Eyes: No scleral injection HENT: No OP obstrucion Head: Normocephalic.  Cardiovascular: Normal rate and regular rhythm.  Respiratory: Effort normal, non-labored breathing GI: Soft.  No distension. There is no tenderness.  Skin: WDI  Neuro: Mental Status: Patient is awake, alert, interactive and appropriate Patient is able to give a clear and coherent history. No signs of aphasia or neglect Cranial Nerves: II: Visual Fields are full.  III,IV, VI: EOMI without ptosis or diploplia. Pupils are equal, round, and reactive to light.   V: Facial sensation is symmetric to temperature VII: Slight left facial droop at rest VIII: hearing is intact to voice X: Uvula elevates  symmetrically XI: Shoulder shrug is symmetric. XII: tongue is midline without atrophy or fasciculations.  Motor: Tone is normal. Bulk is normal. 5/5 strength was present in all four extremities.  Sensory: Sensation is symmetric to light touch and temperature in the arms and legs. Deep Tendon Reflexes: 3+ and brisk at bicep, brachioradialis, knee jerk and 2+ at ankle jerk Plantars: Toes are downgoing bilaterally.  Cerebellar: FNF and HKS are intact bilaterally  He got a 17 on a [place] performed by speech therapy  Labs I have reviewed labs in epic and the results pertinent to this consultation are:   CBC    Component Value Date/Time   WBC 6.4 12/01/2018 0902   RBC 4.69 12/01/2018 0902   HGB 14.2 12/01/2018 0902   HCT 42.6 12/01/2018 0902   PLT 203 12/01/2018 0902   MCV 90.8 12/01/2018 0902   MCH 30.3 12/01/2018 0902   MCHC 33.3 12/01/2018 0902   RDW 13.5 12/01/2018 0902   LYMPHSABS 1.8 12/01/2018 0902   MONOABS 0.6 12/01/2018 0902   EOSABS 0.2 12/01/2018 0902   BASOSABS 0.1 12/01/2018 0902    CMP     Component Value Date/Time   NA 138 12/01/2018 0902   K 3.9 12/01/2018 0902   CL 102 12/01/2018 0902   CO2 27 12/01/2018 0902   GLUCOSE 159 (H) 12/01/2018 0902   BUN 15 12/01/2018 0902   CREATININE 1.30 (H) 12/01/2018 0904   CALCIUM 9.2 12/01/2018 0902   PROT 7.2 12/01/2018 0902   ALBUMIN 4.3 12/01/2018 0902   AST 21 12/01/2018 0902   ALT 19 12/01/2018 0902   ALKPHOS 67 12/01/2018 0902   BILITOT 1.2 12/01/2018 0902   GFRNONAA 59 (L) 12/01/2018 0902   GFRAA >60 12/01/2018 0902    Lipid Panel  No results found for: CHOL, TRIG, HDL, CHOLHDL, VLDL, LDLCALC, LDLDIRECT   Imaging I have reviewed the images obtained:  CT-scan of the brain- chronic small vessel ischemic changes of the hemispheric white matter, aspect of 10, no acute finding by CT  MRI examination of the brain-pending  MRA of head-pending  Etta Quill PA-C [facility] [phone]  M-F  (9:00 am- 5:00 PM)  12/01/2018, 12:35 PM   I have seen the patient and reviewed the above note He developed left-sided numbness and weakness and presented to [facility] where he was treated with IV TPA with significant improvement.  Family reports that he may have some cognitive difficulties at baseline.  Assessment:  This is a 68 year old male with acute onset of left sided numbness tingling followed by left leg weakness.  Patient was administered TPA at [facility] and transferred to [facility].  Upon arrival at [facility] patient showed significant improvement with the only localizing abnormality is a left facial droop.  He does appear to have some underlying dementia I suspect.  I will check a TSH and B12 as well.  Plan:  Acute Ischemic Stroke   Acuity: Acute  -Continue Statin -Hold Aspirin until 24 hour  post tPA neuroimaging is stable and without evidence of bleeding -Blood pressure control, goal of SYS <180 -MRI/ECHO/A1C/Lipid panel. -Hyperglycemia management per SSI to maintain glucose 140-180mg /dL. -PT/OT/ST therapies and recommendations when able   Essential (primary) hypertension -Aggressive BP control, goal SBP < 180 -TTE   Hyperlipidemia, unspecified  - Statin for goal LDL < 70 -goal HgbA1c < 7  Cognitive difficulty: B12, TSH  Prophylaxis DVT: SCD GI: Senokot Bowel: Senokot  Diet: Heart healthy Code Status: Full Code      Roland Rack, MD [facility] [phone]  If 7pm- 7am, please page neurology on call as listed in [place].

## 2018-12-01 NOTE — ED Notes (Signed)
EDP at bedside at this time evaluating pt at this time.

## 2018-12-01 NOTE — ED Notes (Signed)
Per Teleneuro...  Total Dose: 83.7 Bolus: 8.4 Rate: 75.3 mL/hr Waste: 16.3 mL  Read back a verified with tele staff. Waste witnessed by [employer], Therapist, sports.

## 2018-12-01 NOTE — ED Notes (Signed)
[facility] arrived to transport patient to [facility].

## 2018-12-01 NOTE — ED Notes (Signed)
EDP at bedside updating patient. 

## 2018-12-01 NOTE — ED Notes (Signed)
Waiting on teleneurologist to respond. Tele neuro RN on screen.

## 2018-12-01 NOTE — Progress Notes (Signed)
PT Cancellation Note  Patient Details Name: Ralph Martinez MRN: [id number] DOB: Sep 23, 1950   Cancelled Treatment:    Reason Eval/Treat Not Completed: Active bedrest order   Ellamae Sia, PT, DPT Acute Rehabilitation Services Pager [phone] Office [phone]    Willy Eddy 12/01/2018, 1:31 PM

## 2018-12-01 NOTE — Plan of Care (Signed)
Pt passed swallow screen and started on clear liquids with intentions of transitioning to cardiac diet for dinner. NIH - 0 with no apparent clinical deficits.  Alert and oriented. [place], [place]

## 2018-12-01 NOTE — ED Triage Notes (Signed)
Pt reports sudden onset of LT sided numbness that started in legs and radiated to LT arm and LT side of face. States it began around 0715. States he felt normal when he woke up. Pt denies vision changes, weakness, or speech difficulties. No facial droop noted at this time. Denies CP, SOB. EDP at bedside.

## 2018-12-01 NOTE — ED Notes (Signed)
PT taken to CT scan at this time by ED RN.

## 2018-12-01 NOTE — ED Provider Notes (Signed)
Emergency Department Provider Note   I have reviewed the triage vital signs and the nursing notes.   HISTORY  Chief Complaint Code Stroke   HPI Ralph Martinez is a 68 y.o. male with PMH of HTN and HLD presents to the emergency department for evaluation of left side numbness.  Patient woke this morning at 6 AM without symptoms.  He took his blood pressure medication and went about his morning activities.  He states that approximately 7:15 AM he developed numbness worse in the left leg which then proceeded to involve his left arm and left face.  He states he is feeling somewhat "fuzzy" in the head but denies headache.  No vision change.  No difficulty swallowing or speaking.  He denies specific weakness or balance difficulty.  No chest pain or shortness of breath.  No abdominal discomfort.  No prior history of stroke.  The patient is not anticoagulated.  Past Medical History:  Diagnosis Date   High cholesterol    Hypertension    Kidney stones     Patient Active Problem List   Diagnosis Date Noted   CVA (cerebral vascular accident) ([place]) 12/01/2018   Acute cholecystitis 07/13/2016    Past Surgical History:  Procedure Laterality Date   CHOLECYSTECTOMY N/A 07/14/2016   Procedure: LAPAROSCOPIC CHOLECYSTECTOMY;  Surgeon: Vickie Epley, MD;  Location: [facility];  Service: General;  Laterality: N/A;   KIDNEY STONE SURGERY      Allergies Codeine and Erythromycin  History reviewed. No pertinent family history.  Social History Social History   Tobacco Use   Smoking status: Former Smoker    Packs/day: 0.08    Types: Cigarettes    Last attempt to quit: 07/22/1978    Years since quitting: 40.3   Smokeless tobacco: Former Systems developer  Substance Use Topics   Alcohol use: Yes    Comment: occasional   Drug use: No    Review of Systems  Constitutional: No fever/chills Eyes: No visual changes. ENT: No sore throat. Cardiovascular: Denies chest pain. Respiratory: Denies  shortness of breath. Gastrointestinal: No abdominal pain.  No nausea, no vomiting.  No diarrhea.  No constipation. Genitourinary: Negative for dysuria. Musculoskeletal: Negative for back pain. Skin: Negative for rash. Neurological: Negative for headaches or weakness. Positive numbness on the left.   10-point ROS otherwise negative.  ____________________________________________   PHYSICAL EXAM:  VITAL SIGNS: Vitals:   12/01/18 0907 12/01/18 0910  BP:    Pulse: (!) 56 (!) 58  Resp: 19 20  SpO2: 100% 100%     Constitutional: Alert and oriented. Well appearing and in no acute distress. Eyes: Conjunctivae are normal. PERRL.  Head: Atraumatic. Nose: No congestion/rhinnorhea. Mouth/Throat: Mucous membranes are moist.   Neck: No stridor.   Cardiovascular: Normal rate, regular rhythm. Good peripheral circulation. Grossly normal heart sounds.   Respiratory: Normal respiratory effort.  No retractions. Lungs CTAB. Gastrointestinal: Soft and nontender. No distention.  Musculoskeletal: No lower extremity tenderness nor edema. No gross deformities of extremities. Neurologic:  Normal speech and language. Decreased sensation to touch over the left face, arm, and leg. 4+/5 grip strength on the left and left hip flexors. 5/5 strength on the right. No facial asymmetry.  Skin:  Skin is warm, dry and intact. No rash noted.  NIHSS: 4  ____________________________________________   LABS (all labs ordered are listed, but only abnormal results are displayed)  Labs Reviewed  COMPREHENSIVE METABOLIC PANEL - Abnormal; Notable for the following components:      Result  Value   Glucose, Bld 159 (*)    Creatinine, Ser 1.26 (*)    GFR calc non Af Amer 59 (*)    All other components within normal limits  I-STAT CREATININE, ED - Abnormal; Notable for the following components:   Creatinine, Ser 1.30 (*)    All other components within normal limits  CBG MONITORING, ED - Abnormal; Notable for the  following components:   Glucose-Capillary 153 (*)    All other components within normal limits  ETHANOL  PROTIME-INR  APTT  CBC  DIFFERENTIAL  RAPID URINE DRUG SCREEN, HOSP PERFORMED  URINALYSIS, ROUTINE W REFLEX MICROSCOPIC   ____________________________________________  EKG   EKG Interpretation  Date/Time:  Wednesday December 01 2018 08:57:12 EDT Ventricular Rate:  60 PR Interval:    QRS Duration: 144 QT Interval:  443 QTC Calculation: 443 R Axis:   52 Text Interpretation:  Sinus rhythm Right bundle branch block No STEMI.  Confirmed by Nanda Quinton [phone]) on 12/01/2018 9:16:41 AM       ____________________________________________  RADIOLOGY  Ct Head Code Stroke Wo Contrast  Result Date: 12/01/2018 CLINICAL DATA:  Code stroke. Sudden onset left-sided numbness beginning 0715 hours EXAM: CT HEAD WITHOUT CONTRAST TECHNIQUE: Contiguous axial images were obtained from the base of the skull through the vertex without intravenous contrast. COMPARISON:  07/16/2004 FINDINGS: Brain: Brain shows chronic small-vessel ischemic change of the deep white matter. No cortical or large vessel territory infarction. No mass lesion, hemorrhage, hydrocephalus or extra-axial collection. Vascular: No abnormal vascular finding. Skull: Negative Sinuses/Orbits: Clear/normal Other: None ASPECTS ([place] Stroke Program Early CT Score) - Ganglionic level infarction (caudate, lentiform nuclei, internal capsule, insula, M1-M3 cortex): 7 - Supraganglionic infarction (M4-M6 cortex): 3 Total score (0-10 with 10 being normal): 10 IMPRESSION: 1. 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 No acute finding by CT. 2. ASPECTS is 10. 3. These results were called by telephone at the time of interpretation on 12/01/2018 at 9:12 am to Dr. Nanda Quinton , who verbally acknowledged these results. Electronically Signed   By: Nelson Chimes M.D.   On: 12/01/2018 09:13     ____________________________________________   PROCEDURES  Procedure(s) performed:   .Critical Care Performed by: Margette Fast, MD Authorized by: Margette Fast, MD   Critical care provider statement:    Critical care time (minutes):  35   Critical care time was exclusive of:  Separately billable procedures and treating other patients and teaching time   Critical care was necessary to treat or prevent imminent or life-threatening deterioration of the following conditions:  CNS failure or compromise   Critical care was time spent personally by me on the following activities:  Blood draw for specimens, development of treatment plan with patient or surrogate, discussions with consultants, evaluation of patient's response to treatment, examination of patient, obtaining history from patient or surrogate, ordering and performing treatments and interventions, ordering and review of laboratory studies, ordering and review of radiographic studies, pulse oximetry, re-evaluation of patient's condition and review of old charts   I assumed direction of critical care for this patient from another provider in my specialty: no      ____________________________________________   INITIAL IMPRESSION / [place] / ED COURSE  Pertinent labs & imaging results that were available during my care of the patient were reviewed by me and considered in my medical decision making (see chart for details).   Patient arrives to the emergency department with acute onset  left side numbness.  He has some mild weakness on my exam as well.  No speech changes or apparent visual field cuts.  Last seen normal 7:15 AM.  Patient is within the window to consider tPA.  Code stroke activated during my evaluation.   09:13 AM  Nothing acute on CT per Radiology.   09:31 AM Discussed with Tele-neuro. Patient given tPA. Watching BP for now. No meds needed to get BP in range. Labs and imaging reviewed. Will discuss  with neurology at [facility] for admission. No concern for LVO per Neurology.   09:49 AM Discussed patient's case with Neurology, Dr. Leonel Ramsay to request admission. Patient and family (if present) updated with plan. Care transferred to Neurology service. I have placed temporary admit orders at their request.   I reviewed all nursing notes, vitals, pertinent old records, EKGs, labs, imaging (as available).  ____________________________________________  FINAL CLINICAL IMPRESSION(S) / ED DIAGNOSES  Final diagnoses:  Cerebrovascular accident (CVA), unspecified mechanism ([place])    MEDICATIONS GIVEN DURING THIS VISIT:  Medications  alteplase (ACTIVASE) 1 mg/mL infusion 83.7 mg (83.7 mg Intravenous New Bag/Given 12/01/18 0932)    Followed by  0.9 %  sodium chloride infusion (has no administration in time range)   Note:  This document was prepared using Dragon voice recognition software and may include unintentional dictation errors.  Nanda Quinton, MD Emergency Medicine    Raigen Jagielski, [facility], MD 12/01/18 [phone]

## 2018-12-01 NOTE — ED Notes (Signed)
[facility] updating patients wife on pt status and plan.

## 2018-12-02 ENCOUNTER — Inpatient Hospital Stay (HOSPITAL_COMMUNITY): Payer: Medicare HMO

## 2018-12-02 DIAGNOSIS — I6300 Cerebral infarction due to thrombosis of unspecified precerebral artery: Secondary | ICD-10-CM

## 2018-12-02 DIAGNOSIS — E785 Hyperlipidemia, unspecified: Secondary | ICD-10-CM | POA: Diagnosis present

## 2018-12-02 DIAGNOSIS — I639 Cerebral infarction, unspecified: Secondary | ICD-10-CM

## 2018-12-02 DIAGNOSIS — N289 Disorder of kidney and ureter, unspecified: Secondary | ICD-10-CM

## 2018-12-02 DIAGNOSIS — I10 Essential (primary) hypertension: Secondary | ICD-10-CM | POA: Diagnosis present

## 2018-12-02 LAB — LIPID PANEL
Cholesterol: 169 mg/dL (ref 0–200)
HDL: 44 mg/dL (ref >40)
LDL Cholesterol: 103 mg/dL — ABNORMAL HIGH (ref 0–99)
Total CHOL/HDL Ratio: 3.8 RATIO
Triglycerides: 109 mg/dL (ref <150)
VLDL: 22 mg/dL (ref 0–40)

## 2018-12-02 LAB — GLUCOSE, CAPILLARY: Glucose-Capillary: 96 mg/dL (ref 70–99)

## 2018-12-02 LAB — CBC
HCT: 36.8 % — ABNORMAL LOW (ref 39.0–52.0)
Hemoglobin: 12.8 g/dL — ABNORMAL LOW (ref 13.0–17.0)
MCH: 30.7 pg (ref 26.0–34.0)
MCHC: 34.8 g/dL (ref 30.0–36.0)
MCV: 88.2 fL (ref 80.0–100.0)
Platelets: 176 10*3/uL (ref 150–400)
RBC: 4.17 MIL/uL — ABNORMAL LOW (ref 4.22–5.81)
RDW: 13.2 % (ref 11.5–15.5)
WBC: 7.2 10*3/uL (ref 4.0–10.5)
nRBC: 0 % (ref 0.0–0.2)

## 2018-12-02 LAB — HIV ANTIBODY (ROUTINE TESTING W REFLEX): HIV Screen 4th Generation wRfx: NONREACTIVE

## 2018-12-02 LAB — HEMOGLOBIN A1C
Hgb A1c MFr Bld: 5.4 % (ref 4.8–5.6)
Mean Plasma Glucose: 108.28 mg/dL

## 2018-12-02 MED ORDER — ATORVASTATIN CALCIUM 40 MG PO TABS: 40.0000 mg | ORAL_TABLET | Freq: Every day | ORAL | Status: DC

## 2018-12-02 MED ORDER — ASPIRIN 81 MG PO TBEC: 81.0000 mg | DELAYED_RELEASE_TABLET | Freq: Every day | ORAL | Status: AC

## 2018-12-02 MED ORDER — CLOPIDOGREL BISULFATE 75 MG PO TABS
75.0000 mg | ORAL_TABLET | Freq: Every day | ORAL | Status: DC
  Administered 2018-12-02: 13:00:00 75 mg via ORAL
  Filled 2018-12-02: qty 1

## 2018-12-02 MED ORDER — ASPIRIN EC 81 MG PO TBEC
81.0000 mg | DELAYED_RELEASE_TABLET | Freq: Every day | ORAL | Status: DC
  Administered 2018-12-02: 81 mg via ORAL
  Filled 2018-12-02: qty 1

## 2018-12-02 MED ORDER — CLOPIDOGREL BISULFATE 75 MG PO TABS: 75.0000 mg | ORAL_TABLET | Freq: Every day | ORAL | 0 refills | Status: DC

## 2018-12-02 MED ORDER — ATORVASTATIN CALCIUM 40 MG PO TABS: 40.0000 mg | ORAL_TABLET | Freq: Every day | ORAL | 2 refills | Status: AC

## 2018-12-02 NOTE — Progress Notes (Signed)
Call received from pt's daughter Nyron Mozer at [phone]. Dad was confused about which meds he had taken already today. D/c instructions reviewed with Eugenie Birks over phone. She states understanding.

## 2018-12-02 NOTE — Discharge Summary (Signed)
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 High cholesterol   . Hypertension   . Kidney stones    Past Surgical History:  Procedure Laterality Date  . CHOLECYSTECTOMY N/A 07/14/2016   Procedure: LAPAROSCOPIC CHOLECYSTECTOMY;  Surgeon: Ralph Epley, MD;  Location: [facility];  Service: General;  Laterality: N/A;  . KIDNEY STONE SURGERY      Allergies as of 12/02/2018      Reactions   Codeine Hives   Erythromycin Hives, Itching      Medication List    STOP taking these medications   losartan 50 MG tablet Commonly known as:  COZAAR   pravastatin 20 MG tablet Commonly known as:  PRAVACHOL   traMADol-acetaminophen 37.5-325 MG tablet Commonly known as:  ULTRACET     TAKE these medications   aspirin 81 MG EC tablet Take 1 tablet (81 mg total) by mouth daily.   atorvastatin 40 MG tablet Commonly known as:  LIPITOR Take 1 tablet (40 mg total) by mouth daily at 6 PM.   clopidogrel 75 MG tablet Commonly known as:  PLAVIX Take 1 tablet (75 mg total) by mouth daily. Take with aspirin for 21 days then stop plavix. Continue aspirin   polyvinyl alcohol 1.4 % ophthalmic solution Commonly known as:  LIQUIFILM TEARS Place 2 drops into both eyes as needed for dry eyes.   valsartan 320 MG tablet Commonly known as:  DIOVAN Take 320 mg by mouth daily.       LABORATORY STUDIES CBC    Component Value Date/Time   WBC 7.2 12/02/2018 0437   RBC 4.17 (L) 12/02/2018 0437   HGB  12.8 (L) 12/02/2018 0437   HCT 36.8 (L) 12/02/2018 0437   PLT 176 12/02/2018 0437   MCV 88.2 12/02/2018 0437   MCH 30.7 12/02/2018 0437   MCHC 34.8 12/02/2018 0437   RDW 13.2 12/02/2018 0437   LYMPHSABS 1.8 12/01/2018 0902   MONOABS 0.6 12/01/2018 0902   EOSABS 0.2 12/01/2018 0902   BASOSABS 0.1 12/01/2018 0902   CMP    Component Value Date/Time   NA 138 12/01/2018 0902   K 3.9 12/01/2018 0902   CL 102 12/01/2018 0902   CO2 27 12/01/2018 0902   GLUCOSE 159 (H) 12/01/2018 0902   BUN 15 12/01/2018 0902   CREATININE 1.30 (H) 12/01/2018 0904   CALCIUM 9.2 12/01/2018 0902   PROT 7.2 12/01/2018 0902   ALBUMIN 4.3 12/01/2018 0902   AST 21 12/01/2018 0902   ALT 19 12/01/2018 0902   ALKPHOS 67 12/01/2018 0902   BILITOT 1.2 12/01/2018 0902   GFRNONAA 59 (L) 12/01/2018 0902   GFRAA >60 12/01/2018 0902   COAGS Lab Results  Component Value Date   INR 1.0 12/01/2018   Lipid Panel    Component Value Date/Time   CHOL 169 12/02/2018 0437   TRIG 109 12/02/2018 0437   HDL 44 12/02/2018 0437   CHOLHDL 3.8 12/02/2018 0437  VLDL 22 12/02/2018 0437   LDLCALC 103 (H) 12/02/2018 0437   HgbA1C  Lab Results  Component Value Date   HGBA1C 5.4 12/02/2018   Urinalysis    Component Value Date/Time   COLORURINE STRAW (A) 12/01/2018 0902   APPEARANCEUR CLEAR 12/01/2018 0902   LABSPEC 1.003 (L) 12/01/2018 0902   PHURINE 6.0 12/01/2018 0902   GLUCOSEU NEGATIVE 12/01/2018 0902   HGBUR NEGATIVE 12/01/2018 0902   BILIRUBINUR NEGATIVE 12/01/2018 0902   KETONESUR NEGATIVE 12/01/2018 0902   PROTEINUR NEGATIVE 12/01/2018 0902   NITRITE NEGATIVE 12/01/2018 0902   LEUKOCYTESUR TRACE (A) 12/01/2018 0902   Urine Drug Screen     Component Value Date/Time   LABOPIA NONE DETECTED 12/01/2018 0902   COCAINSCRNUR NONE DETECTED 12/01/2018 0902   LABBENZ NONE DETECTED 12/01/2018 0902   AMPHETMU NONE DETECTED 12/01/2018 0902   THCU NONE DETECTED 12/01/2018 0902   LABBARB NONE DETECTED 12/01/2018  0902    Alcohol Level    Component Value Date/Time   ETH <10 12/01/2018 0902     SIGNIFICANT DIAGNOSTIC STUDIES Mr Brain Wo Contrast  Result Date: 12/02/2018 CLINICAL DATA:  Sudden onset of left-sided numbness beginning yesterday. EXAM: MRI HEAD WITHOUT CONTRAST MRA HEAD WITHOUT CONTRAST TECHNIQUE: Multiplanar, multiecho pulse sequences of the brain and surrounding structures were obtained without intravenous contrast. Angiographic images of the head were obtained using MRA technique without contrast. COMPARISON:  Head CT 12/01/2018 FINDINGS: MRI HEAD FINDINGS Brain: Diffusion imaging shows a subcentimeter acute infarction in the right thalamus. No other acute infarction. Elsewhere, the brainstem and cerebellum are normal. There are moderate chronic small-vessel ischemic changes of the hemispheric white matter. No mass lesion, hemorrhage, hydrocephalus or extra-axial collection. Vascular: Major vessels at the base of the brain show flow. Skull and upper cervical spine: Negative Sinuses/Orbits: Clear/normal Other: None MRA HEAD FINDINGS Both internal carotid arteries are widely patent into the brain. No siphon stenosis. The anterior and middle cerebral vessels are patent without proximal stenosis, aneurysm or vascular malformation. Both vertebral arteries are widely patent to the basilar. No basilar stenosis. Posterior circulation branch vessels appear normal. IMPRESSION: Subcentimeter acute infarction in the right thalamus. No hemorrhage or swelling. Moderate chronic small-vessel ischemic changes of the cerebral hemispheric white matter. Normal intracranial MR angiography of the large and medium size vessels. Electronically Signed   By: Ralph Martinez M.D.   On: 12/02/2018 11:22   Mr Jodene Nam Head Wo Contrast  Result Date: 12/02/2018 CLINICAL DATA:  Sudden onset of left-sided numbness beginning yesterday. EXAM: MRI HEAD WITHOUT CONTRAST MRA HEAD WITHOUT CONTRAST TECHNIQUE: Multiplanar, multiecho pulse  sequences of the brain and surrounding structures were obtained without intravenous contrast. Angiographic images of the head were obtained using MRA technique without contrast. COMPARISON:  Head CT 12/01/2018 FINDINGS: MRI HEAD FINDINGS Brain: Diffusion imaging shows a subcentimeter acute infarction in the right thalamus. No other acute infarction. Elsewhere, the brainstem and cerebellum are normal. There are moderate chronic small-vessel ischemic changes of the hemispheric white matter. No mass lesion, hemorrhage, hydrocephalus or extra-axial collection. Vascular: Major vessels at the base of the brain show flow. Skull and upper cervical spine: Negative Sinuses/Orbits: Clear/normal Other: None MRA HEAD FINDINGS Both internal carotid arteries are widely patent into the brain. No siphon stenosis. The anterior and middle cerebral vessels are patent without proximal stenosis, aneurysm or vascular malformation. Both vertebral arteries are widely patent to the basilar. No basilar stenosis. Posterior circulation branch vessels appear normal. IMPRESSION: Subcentimeter acute infarction in the right thalamus. No hemorrhage or  swelling. Moderate chronic small-vessel ischemic changes of the cerebral hemispheric white matter. Normal intracranial MR angiography of the large and medium size vessels. Electronically Signed   By: Ralph Martinez M.D.   On: 12/02/2018 11:22   Ct Head Code Stroke Wo Contrast  Result Date: 12/01/2018 CLINICAL DATA:  Code stroke. Sudden onset left-sided numbness beginning 0715 hours EXAM: CT HEAD WITHOUT CONTRAST TECHNIQUE: Contiguous axial images were obtained from the base of the skull through the vertex without intravenous contrast. COMPARISON:  07/16/2004 FINDINGS: Brain: Brain shows chronic small-vessel ischemic change of the deep white matter. No cortical or large vessel territory infarction. No mass lesion, hemorrhage, hydrocephalus or extra-axial collection. Vascular: No abnormal vascular  finding. Skull: Negative Sinuses/Orbits: Clear/normal Other: None ASPECTS ([place] Stroke Program Early CT Score) - Ganglionic level infarction (caudate, lentiform nuclei, internal capsule, insula, M1-M3 cortex): 7 - Supraganglionic infarction (M4-M6 cortex): 3 Total score (0-10 with 10 being normal): 10 IMPRESSION: 1. 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 No acute finding by CT. 2. ASPECTS is 10. 3. These results were called by telephone at the time of interpretation on 12/01/2018 at 9:12 am to Dr. Nanda Quinton , who verbally acknowledged these results. Electronically Signed   By: Ralph Martinez M.D.   On: 12/01/2018 09:13   Carotid (at [place] Only)  Result Date: 12/02/2018 Carotid Arterial Duplex Study Indications:  CVA. Risk Factors: Hypertension, hyperlipidemia. Performing Technologist: Abram Sander RVS  Examination Guidelines: A complete evaluation includes B-mode imaging, spectral Doppler, color Doppler, and power Doppler as needed of all accessible portions of each vessel. Bilateral testing is considered an integral part of a complete examination. Limited examinations for reoccurring indications may be performed as noted.  Right Carotid Findings: +----------+--------+--------+--------+-----------+--------+           PSV cm/sEDV cm/sStenosisDescribe   Comments +----------+--------+--------+--------+-----------+--------+ CCA Prox  76      13              homogeneous         +----------+--------+--------+--------+-----------+--------+ CCA Distal62      17              homogeneous         +----------+--------+--------+--------+-----------+--------+ ICA Prox  70      21      1-39%   homogeneous         +----------+--------+--------+--------+-----------+--------+ ICA Distal97      33                                  +----------+--------+--------+--------+-----------+--------+ ECA       72      13                                   +----------+--------+--------+--------+-----------+--------+ +----------+--------+-------+--------+-------------------+           PSV cm/sEDV cmsDescribeArm Pressure (mmHG) +----------+--------+-------+--------+-------------------+ [id number]                                         +----------+--------+-------+--------+-------------------+ +---------+--------+--+--------+--+---------+ VertebralPSV cm/s52EDV cm/s17Antegrade +---------+--------+--+--------+--+---------+  Left Carotid Findings: +----------+--------+--------+--------+-----------+--------+           PSV cm/sEDV cm/sStenosisDescribe   Comments +----------+--------+--------+--------+-----------+--------+ CCA Prox  127     24  homogeneous         +----------+--------+--------+--------+-----------+--------+ CCA Distal81      23              homogeneous         +----------+--------+--------+--------+-----------+--------+ ICA Prox  86      27              homogeneous         +----------+--------+--------+--------+-----------+--------+ ICA Distal84      35                                  +----------+--------+--------+--------+-----------+--------+ ECA       69      13                                  +----------+--------+--------+--------+-----------+--------+ +----------+--------+--------+--------+-------------------+ SubclavianPSV cm/sEDV cm/sDescribeArm Pressure (mmHG) +----------+--------+--------+--------+-------------------+           111                                         +----------+--------+--------+--------+-------------------+ +---------+--------+--+--------+--+---------+ VertebralPSV cm/s49EDV cm/s15Antegrade +---------+--------+--+--------+--+---------+  Summary: Right Carotid: Velocities in the right ICA are consistent with a 1-39% stenosis. Left Carotid: Velocities in the left ICA are consistent with a 1-39% stenosis. Vertebrals:  Bilateral vertebral arteries demonstrate antegrade flow. *See table(s) above for measurements and observations.  Electronically signed by Antony Contras MD on 12/02/2018 at 12:10:31 PM.    Final     2D Echocardiogram   1. The left ventricle has normal systolic function with an ejection fraction of 60-65%. The cavity size was normal. Left ventricular diastolic parameters were normal. No evidence of left ventricular regional wall motion abnormalities.  2. The right ventricle has normal systolic function. The cavity was normal. There is no increase in right ventricular wall thickness.  3. The aortic valve is tricuspid.  4. The aortic root is normal in size and structure.  5. No intracardiac thrombi or masses were visualized.  HISTORY OF PRESENT ILLNESS Ralph Martinez is a 68 y.o. male with history of kidney stones, hypertension, hypercholesterolemia.  Patient states that he got up around 6 AM this morning 12/01/2018 without symptoms.  He was making a pot of coffee and took his blood pressure medications.  At approximately 7:00 (LKW 715) he developed numbness that went from his leg up to his arm which then proceeded to include his face with significant left leg weakness.  He denies any other symptoms.  He did call EMS which brought him to [facility].  Patient was administered TPA there and sent to [facility] for further monitoring.  Upon arrival patient had significantly improved.  The only findings were a left facial droop.  Patient feels as though he is back to normal.  He denies taking aspirin on a daily basis. Premorbid modified Rankin scale (mRS): 0. NIH stroke score: 1.  HOSPITAL COURSE Ralph Martinez is a 68 y.o. male with history of Kidney stones, HTN, HLD presenting with left sided tingling and weakness which improved significantly following tPA administration 12/01/2018 at 0932.   Stroke:  Small right thalamic infarct secondary to small vessel disease source  Code Stroke CT head No acute  stroke. Small vessel disease of hemispheric WM, progressed since  2005. ASPECTS 10.     MRI  Small R thalamic infarct.  MRA  normal  Carotid Doppler  B ICA 1-39% stenosis, VAs antegrade   2D Echo EF 60-65%. No source of embolus   LDL 103  HgbA1c 5.4  SCDs for VTE prophylaxis  No antithrombotic prior to admission, now on aspirin 81 mg daily and clopidogrel 75 mg daily x 3 weeks then aspirin alone.   Therapy recommendations:  No therapy needs  Disposition:  D/c home  Hypertension  Home meds:  Valsartan 320, cozaar 50  Stable BP goal per post tPA protocol x 24h following tPA administration . Long-term BP goal normotensive . Home meds resumed  Hyperlipidemia  Home meds:  pravachol 20, resumed in hospital  LDL 103, goal < 70  Given stroke, change to high intensity statin  Continue statin at discharge  Other Stroke Risk Factors  Advanced age  Former Cigarette smoker, quit 40 yrs ago  Former smokeless tobacoo user  ETOH use, advised to drink no more than 2 drink(s) a day  Overweight, Body mass index is 27.8 kg/m., recommend weight loss, diet and exercise as appropriate   Other Active Problems  Renal insufficiency Cr 1.3   DISCHARGE EXAM Blood pressure 138/72, pulse 66, temperature 97.9 F (36.6 C), temperature source Oral, resp. rate 15, height 6' (1.829 m), weight 93 kg, SpO2 99 %.  PHYSICAL EXAM :  pleasantelderly Caucasian male not in distress. . Afebrile. Head is nontraumatic. Neck is supple without bruit.    Cardiac exam no murmur or gallop. Lungs are clear to auscultation. Distal pulses are well felt.  Neurological Exam ;  Awake  Alert oriented x 3. Normal speech and language.eye movements full without nystagmus.fundi were not visualized. Vision acuity and fields appear normal. Hearing is normal. Palatal movements are normal. Face symmetric. Tongue midline. Normal strength, tone, reflexes and coordination. Normal sensation. Gait deferred.  NIHSS 0       Discharge Diet   Heart healthy thin liquids  DISCHARGE PLAN  Disposition:  Return home  aspirin 81 mg daily and clopidogrel 75 mg daily for secondary stroke prevention for 3 weeks then aspirin alone.  Ongoing stroke risk factor control by Primary Care Physician at time of discharge  Follow-up Ralph Noble, MD in 2 weeks.  Follow-up in [facility] in 4 weeks, office to schedule an appointment.   35 minutes were spent preparing discharge.  Burnetta Sabin, MSN, APRN, ANVP-BC, AGPCNP-BC Advanced Practice Stroke Nurse [place] for Schedule & Pager information 12/02/2018 12:33 PM  I have personally obtained history,examined this patient, reviewed notes, independently viewed imaging studies, participated in medical decision making and plan of care.ROS completed by me personally and pertinent positives fully documented  I have made any additions or clarifications directly to the above note. Agree with note above.   Antony Contras, MD Medical Director [facility] Pager: [phone] 12/02/2018 3:34 PM

## 2018-12-02 NOTE — Progress Notes (Signed)
Carotid duplex has been completed.   Preliminary results in CV Proc.   Abram Sander 12/02/2018 9:02 AM

## 2018-12-02 NOTE — TOC Transition Note (Signed)
Transition of Care [facility]) - CM/SW Discharge Note   Patient Details  Name: RANDOL ZUMSTEIN MRN: [id number] Date of Birth: [birth year]  Transition of Care [facility]) CM/SW Contact:  Ella Bodo, RN Phone Number: 12/02/2018  Clinical Narrative:    Pt is a 68 y.o. M with significant PMH of hypertension who presents with left sided weakness and numbness. MRI showing subcentimeter acute infarction in the right thalamus. Received tPA 4/15.  PTA, pt independent, lives with spouse.  Pt medically stable for dc home today with family, who can provide 24h support.     PT/OT recommending no OP follow up and no DME.                              Discharge Plan and Services  Home/Self Care                          Readmission Risk Interventions No flowsheet data found.  Reinaldo Raddle, RN, BSN  Trauma/Neuro ICU Case Manager [phone]

## 2018-12-02 NOTE — Evaluation (Signed)
Occupational Therapy Evaluation Patient Details Name: Ralph Martinez MRN: [id number] DOB: 11-Apr-1951 Today's Date: 12/02/2018    History of Present Illness Pt is a 68 y.o. M with significant PMH of hypertension who presents with left sided weakness and numbness. MRI showing subcentimeter acute infarction in the right thalamus. Received tPA 4/15.   Clinical Impression   Pt admitted with above. He demonstrates cognitive deficits including impaired attention, organization, and problem solving.  He failed the pill box test making more than 3 errors, but he was able to recognize and correct errors when cued.  He does report he frequently forgot to take pm meds at home, and would then make up for them the next day.  Discussed safety implications of this, and discussed strategies to improve accuracy, including use of Alexa for reminders, using a pill box, and having direct supervision from daughter for medication management.  His wife manages the finances.    Pt states "I wasn't the sharpest tool in the box", unsure how close to baseline he is/isn't.  Instructed him to have direct supervision with meds initially, and to self monitor.  Attempted to contact family to update them, but no answer.    At this point no follow up OT recommended, however, if deficits impact his daily routine, he may need some follow up OT.  Encouraged him to discuss with MD at follow up appointment if deficits persist, or impact him.  No further OT recommended.     Follow Up Recommendations  No OT follow up;Supervision - Intermittent    Equipment Recommendations  None recommended by OT    Recommendations for Other Services       Precautions / Restrictions Precautions Precautions: None Restrictions Weight Bearing Restrictions: No      Mobility Bed Mobility Overal bed mobility: Independent             General bed mobility comments: OOB in chair  Transfers Overall transfer level: Independent Equipment used: None                   Balance Overall balance assessment: No apparent balance deficits (not formally assessed)                                         ADL either performed or assessed with clinical judgement   ADL Overall ADL's : Independent                                             Vision Baseline Vision/History: Wears glasses Wears Glasses: Reading only Patient Visual Report: No change from baseline Vision Assessment?: No apparent visual deficits     Perception Perception Perception Tested?: Yes   Praxis      Pertinent Vitals/Pain Pain Assessment: No/denies pain     Hand Dominance Right   Extremity/Trunk Assessment Upper Extremity Assessment Upper Extremity Assessment: Overall WFL for tasks assessed RUE Deficits / Details: Strength 5/5 LUE Deficits / Details: Strength 5/5   Lower Extremity Assessment Lower Extremity Assessment: Defer to PT evaluation RLE Deficits / Details: Strength 5/5 LLE Deficits / Details: Strength 5/5   Cervical / Trunk Assessment Cervical / Trunk Assessment: Normal   Communication Communication Communication: No difficulties   Cognition Arousal/Alertness: Awake/alert Behavior During Therapy: WFL for tasks assessed/performed Overall  Cognitive Status: Impaired/Different from baseline Area of Impairment: Attention;Problem solving;Awareness                   Current Attention Level: Selective;Alternating       Awareness: Emergent Problem Solving: Difficulty sequencing;Requires verbal cues General Comments: Administered the pill box test.  Pt made 5 errors, and required more than 5 mins (which is allowable amount of time).   He was able to correct when cues provided.   His score was not a passing score.  Discussed this with him, and recommend that family supervise him with medications, and discussed use of Alexa as a reminder to take his meds correctly (he reports he frequently forgot to take  his "cholestrol" medications in the pm, and then would try to catch up by taking more tabs the next day.  Discussed importance of taking meds as prescribed and dangers of doubling up on missed doses.  He verbailzed understanding    General Comments  reviewed BEFAST with pt     Exercises     Shoulder Instructions      Home Living Family/patient expects to be discharged to:: Private residence Living Arrangements: Spouse/significant other;Children(daughter) Available Help at Discharge: Family Type of Home: House       Home Layout: Two level Alternate Level Stairs-Number of Steps: 13       Bathroom Toilet: Standard     Home Equipment: None      Lives With: Spouse    Prior Functioning/Environment Level of Independence: Independent        Comments: Retired - he worked in a [employer], enjoys being outside        OT Problem List: Decreased cognition      OT Treatment/Interventions:      OT Goals(Current goals can be found in the care plan section) Acute Rehab OT Goals Patient Stated Goal: to get home  OT Goal Formulation: All assessment and education complete, DC therapy  OT Frequency:     Barriers to D/C:            Co-evaluation              AM-PAC OT "6 Clicks" Daily Activity     Outcome Measure Help from another person eating meals?: None Help from another person taking care of personal grooming?: None Help from another person toileting, which includes using toliet, bedpan, or urinal?: None Help from another person bathing (including washing, rinsing, drying)?: None Help from another person to put on and taking off regular upper body clothing?: None Help from another person to put on and taking off regular lower body clothing?: None 6 Click Score: 24   End of Session Nurse Communication: Mobility status  Activity Tolerance: Patient tolerated treatment well Patient left: in chair;with call bell/phone within reach  OT Visit  Diagnosis: Cognitive communication deficit (R41.841) Symptoms and signs involving cognitive functions: Cerebral infarction                Time: 1324-4010 OT Time Calculation (min): 33 min Charges:  OT General Charges $OT Visit: 1 Visit OT Evaluation $OT Eval Low Complexity: 1 Low OT Treatments $Self Care/Home Management : 8-22 mins  Lucille Passy, OTR/L Acute Rehabilitation Services Pager [phone] Office [phone]   Lucille Passy M 12/02/2018, 12:57 PM

## 2018-12-02 NOTE — Progress Notes (Signed)
Patient's family called and RN gave update.

## 2018-12-02 NOTE — Evaluation (Signed)
Physical Therapy Evaluation Patient Details Name: Ralph Martinez MRN: [id number] DOB: [birth year] Today's Date: 12/02/2018   History of Present Illness  Pt is a 68 y.o. M with significant PMH of hypertension who presents with left sided weakness and numbness. MRI showing subcentimeter acute infarction in the right thalamus. Received tPA 4/15.  Clinical Impression  Patient evaluated by Physical Therapy with no further acute PT needs identified. Pt reporting no residual deficits. Pt ambulating block around unit with independently. BP 152/86 (107) post mobility. Education re: signs and symptoms of a stroke. All education has been completed and the patient has no further questions. No follow-up Physical Therapy or equipment needs. PT is signing off. Thank you for this referral.     Follow Up Recommendations No PT follow up    Equipment Recommendations  None recommended by PT    Recommendations for Other Services       Precautions / Restrictions Precautions Precautions: None Restrictions Weight Bearing Restrictions: No      Mobility  Bed Mobility               General bed mobility comments: OOB in chair  Transfers Overall transfer level: Independent Equipment used: None                Ambulation/Gait Ambulation/Gait assistance: Independent Gait Distance (Feet): 500 Feet Assistive device: None Gait Pattern/deviations: WFL(Within Functional Limits)   Gait velocity interpretation: >2.62 ft/sec, indicative of community ambulatory    Stairs            Wheelchair Mobility    Modified Rankin (Stroke Patients Only) Modified Rankin (Stroke Patients Only) Pre-Morbid Rankin Score: No symptoms Modified Rankin: No symptoms     Balance Overall balance assessment: Independent                                           Pertinent Vitals/Pain Pain Assessment: No/denies pain    Home Living Family/patient expects to be discharged to:: Private  residence Living Arrangements: Spouse/significant other;Children(daughter) Available Help at Discharge: Family Type of Home: House       Home Layout: Two level Home Equipment: None      Prior Function Level of Independence: Independent         Comments: Retired, enjoys being outside     Journalist, newspaper        Extremity/Trunk Assessment   Upper Extremity Assessment Upper Extremity Assessment: RUE deficits/detail;LUE deficits/detail RUE Deficits / Details: Strength 5/5 LUE Deficits / Details: Strength 5/5    Lower Extremity Assessment Lower Extremity Assessment: RLE deficits/detail;LLE deficits/detail RLE Deficits / Details: Strength 5/5 LLE Deficits / Details: Strength 5/5    Cervical / Trunk Assessment Cervical / Trunk Assessment: Normal  Communication   Communication: No difficulties  Cognition Arousal/Alertness: Awake/alert Behavior During Therapy: WFL for tasks assessed/performed Overall Cognitive Status: Within Functional Limits for tasks assessed                                        General Comments      Exercises     Assessment/Plan    PT Assessment Patent does not need any further PT services  PT Problem List         PT Treatment Interventions      PT Goals (Current goals  can be found in the Care Plan section)  Acute Rehab PT Goals Patient Stated Goal: "get outside." PT Goal Formulation: All assessment and education complete, DC therapy    Frequency     Barriers to discharge        Co-evaluation               AM-PAC PT "6 Clicks" Mobility  Outcome Measure Help needed turning from your back to your side while in a flat bed without using bedrails?: None Help needed moving from lying on your back to sitting on the side of a flat bed without using bedrails?: None Help needed moving to and from a bed to a chair (including a wheelchair)?: None Help needed standing up from a chair using your arms (e.g., wheelchair  or bedside chair)?: None Help needed to walk in hospital room?: None Help needed climbing 3-5 steps with a railing? : None 6 Click Score: 24    End of Session   Activity Tolerance: Patient tolerated treatment well Patient left: in chair;with call bell/phone within reach Nurse Communication: Mobility status PT Visit Diagnosis: Other symptoms and signs involving the nervous system (R29.898)    Time: 2353-6144 PT Time Calculation (min) (ACUTE ONLY): 11 min   Charges:   PT Evaluation $PT Eval Low Complexity: 1 Low          Ellamae Sia, PT, DPT Acute Rehabilitation Services Pager [phone] Office [phone]   Willy Eddy 12/02/2018, 11:40 AM

## 2019-01-12 ENCOUNTER — Encounter: Payer: Self-pay | Admitting: *Deleted

## 2019-01-12 ENCOUNTER — Telehealth: Payer: Self-pay | Admitting: *Deleted

## 2019-01-12 NOTE — Telephone Encounter (Signed)
LVM for wife and advised her that since Mr Ralph Martinez saw Dr Leonie Man in hospital for his stroke, the FU needs to be scheduled with Dr Leonie Man. I advised he has opening on Wed, 8:30 am and requested she call back to reschedule.

## 2019-01-12 NOTE — Telephone Encounter (Signed)
Okay with me 

## 2019-01-12 NOTE — Telephone Encounter (Signed)
LVM on mobile # requesting call back. Advised due to current COVID 19 pandemic, our office is severely reducing in person visits in order to minimize the risk to our patients and healthcare providers. We recommend to convert your appointment to a video visit.

## 2019-01-12 NOTE — Telephone Encounter (Addendum)
Wife returned call and stated that they want in office visit. She stated he would not cooperate for a video visit because he wouldn't sit still.  We reviewed the procedures for in office visit, and she had no questions. She did want Dr Leta Baptist to know that he complains of headaches he states are "due to rain". He  lays down a lot, complains of pain in left leg, has dizzy spells when he gets up, is agitated, short tempered, and has mood swings. She stated he "will not be truthful" with Dr Leta Baptist, and due to our policy he has to come into office alone. Wife wanted Dr Leta Baptist to be aware of her concerns. I advised her I will send note to Dr Leta Baptist. Updated EMR; she verbalized understanding, appreciation.

## 2019-01-12 NOTE — Telephone Encounter (Signed)
Pt's wife returned call and r/s appt. Appointment has been switched to Dr. Leonie Man for June 3rd at 8:30am.

## 2019-01-12 NOTE — Addendum Note (Signed)
Addended by: Minna Antis on: 01/12/2019 09:52 AM   Modules accepted: Orders

## 2019-01-17 ENCOUNTER — Ambulatory Visit: Payer: Self-pay | Admitting: Diagnostic Neuroimaging

## 2019-01-18 ENCOUNTER — Ambulatory Visit: Payer: Self-pay | Admitting: Diagnostic Neuroimaging

## 2019-01-19 ENCOUNTER — Ambulatory Visit: Payer: Medicare HMO | Admitting: Neurology

## 2019-01-19 ENCOUNTER — Other Ambulatory Visit: Payer: Self-pay

## 2019-01-19 ENCOUNTER — Encounter: Payer: Self-pay | Admitting: Neurology

## 2019-01-19 VITALS — BP 147/87 | HR 63 | Temp 98.2°F | Ht 68.0 in | Wt 208.0 lb

## 2019-01-19 DIAGNOSIS — E782 Mixed hyperlipidemia: Secondary | ICD-10-CM | POA: Diagnosis not present

## 2019-01-19 DIAGNOSIS — I6381 Other cerebral infarction due to occlusion or stenosis of small artery: Secondary | ICD-10-CM

## 2019-01-19 DIAGNOSIS — R200 Anesthesia of skin: Secondary | ICD-10-CM | POA: Diagnosis not present

## 2019-01-19 NOTE — Progress Notes (Signed)
[facility] [address] [place]. [place] [address] [phone]       OFFICE FOLLOW-UP NOTE  [name] Date of Birth:  Jul 11, 1951 Medical Record Number:  [id number]   HPI: Ralph Martinez is a 68-year African-American male seen today for initial office follow-up visit following hospital admission for stroke in April 2020.  History is obtained from the patient and review of electronic medical records.  I personally reviewed imaging films. Ralph Martinez a 68 y.o.malewith history of kidney stones, hypertension, hypercholesterolemia.Patient states that he got up around 6 AM this morning 12/01/2018 without symptoms. He was making a pot of coffee and took his blood pressure medications. At approximately 7:00 (LKW 715) he developed numbness that went from his leg up to his arm which then proceeded to include his face with significant left leg weakness. He denies any other symptoms. He did call EMS which brought him to [facility]. Patient was administered TPA there and sent to [facility] for further monitoring. Upon arrival patient had significantly improved. The only findings were a left facial droop. Patient feels as though he is back to normal. He denies taking aspirin on a daily basis. Premorbid modified Rankin scale (mRS):0. NIH stroke score:1. CT scan of the bed brain showed no acute abnormality.  MRI scan showed a small right thalamic lacunar infarct.  MRI of the brain showed no significant large vessel intracranial stenosis.  Carotid ultrasound showed no significant extracranial stenosis.  LDL cholesterol was elevated at 103 mg percent.  Hemoglobin A1c was 5.4.  Patient was started on aspirin 81 and Plavix 75 mg daily for 3 weeks followed by aspirin alone.  He states is done well since discharge.  He still gets occasional intermittent left leg numbness but this is transient and appears to be more prominent when he is tired or exhausted.  He has had no recurrent  stroke or TIA symptoms.  He states his blood pressure is well controlled.  It is 147/81 today in our office.  The patient states that interestingly he had removed several ticks from his legs few days prior to his stroke symptoms.  He however did not have the classical erythematous rash or arthralgias from tick bite.  Patient states is tolerating aspirin well without bruising or bleeding.  Is also tolerating Lipitor well without muscle aches and pains.  He is quite physically active and exercises every Martinez.  He is eating healthy and has not gained any weight.  The patient has been dipping snuff but he knows that he has to quit that. ROS:   14 system review of systems is positive for numbness and walking difficulty only and all other systems negative  PMH:  Past Medical History:  Diagnosis Date  . High cholesterol   . Hypertension   . Kidney stones   . Stroke [facility]) 12/01/2018    Social History:  Social History   Socioeconomic History  . Marital status: Married    Spouse name: Ralph Martinez  . Number of children: 2  . Years of education: Not on file  . Highest education level: High school graduate  Occupational History    Comment: retired  Scientific laboratory technician  . Financial resource strain: Not on file  . Food insecurity:    Worry: Not on file    Inability: Not on file  . Transportation needs:    Medical: Not on file    Non-medical: Not on file  Tobacco Use  . Smoking status: Former  Smoker    Packs/Martinez: 0.08    Types: Cigarettes    Last attempt to quit: 07/22/1978    Years since quitting: 40.5  . Smokeless tobacco: Former Network engineer and Sexual Activity  . Alcohol use: Yes    Comment: occasional  . Drug use: No  . Sexual activity: Not on file  Lifestyle  . Physical activity:    Days per week: Not on file    Minutes per session: Not on file  . Stress: Not on file  Relationships  . Social connections:    Talks on phone: Not on file    Gets together: Not on file    Attends  religious service: Not on file    Active member of club or organization: Not on file    Attends meetings of clubs or organizations: Not on file    Relationship status: Not on file  . Intimate partner violence:    Fear of current or ex partner: Not on file    Emotionally abused: Not on file    Physically abused: Not on file    Forced sexual activity: Not on file  Other Topics Concern  . Not on file  Social History Narrative   Lives with wife and daughter   Caffeine- coffee 1 cup, 1 soda a Martinez, green tea 1 glass    Medications:   Current Outpatient Medications on File Prior to Visit  Medication Sig Dispense Refill  . aspirin EC 81 MG EC tablet Take 1 tablet (81 mg total) by mouth daily.    Marland Kitchen atorvastatin (LIPITOR) 40 MG tablet Take 1 tablet (40 mg total) by mouth daily at 6 PM. 30 tablet 2  . polyvinyl alcohol (LIQUIFILM TEARS) 1.4 % ophthalmic solution Place 2 drops into both eyes as needed for dry eyes.    . valsartan (DIOVAN) 320 MG tablet Take 320 mg by mouth daily.     No current facility-administered medications on file prior to visit.     Allergies:   Allergies  Allergen Reactions  . Codeine Hives  . Erythromycin Hives and Itching    Physical Exam General: Frail middle-aged African-American male, seated, in no evident distress Head: head normocephalic and atraumatic.  Neck: supple with no carotid or supraclavicular bruits Cardiovascular: regular rate and rhythm, no murmurs Musculoskeletal: no deformity Skin:  no rash/petichiae Vascular:  Normal pulses all extremities Vitals:   01/19/19 0831  BP: (!) 147/87  Pulse: 63  Temp: 98.2 F (36.8 C)   Neurologic Exam Mental Status: Awake and fully alert. Oriented to place and time. Recent and remote memory intact. Attention span, concentration and fund of knowledge appropriate. Mood and affect appropriate.  Cranial Nerves: Fundoscopic exam reveals sharp disc margins. Pupils equal, briskly reactive to light. Extraocular  movements full without nystagmus. Visual fields full to confrontation. Hearing intact. Facial sensation intact. Face, tongue, palate moves normally and symmetrically.  Motor: Normal bulk and tone. Normal strength in all tested extremity muscles. Sensory.: intact to touch ,pinprick .position and vibratory sensation.  Coordination: Rapid alternating movements normal in all extremities. Finger-to-nose and heel-to-shin performed accurately bilaterally. Gait and Station: Arises from chair without difficulty. Stance is normal. Gait demonstrates normal stride length and balance . Not able to heel, toe and tandem walk without difficulty.  Reflexes: 1+ and symmetric. Toes downgoing.   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  He received IV TPA and has made excellent clinical recovery.  Vascular risk factors of hypertension hyperlipidemia and age only.    PLAN: I had a long d/w patient about his recent stroke, risk for recurrent stroke/TIAs, personally independently reviewed imaging studies and stroke evaluation results and answered questions.Continue aspirin  for secondary stroke prevention and maintain strict control of hypertension with blood pressure goal below 130/90, diabetes with hemoglobin A1c goal below 6.5% and lipids with LDL cholesterol goal below 70 mg/dL. I also advised the patient to eat a healthy diet with plenty of whole grains, cereals, fruits and vegetables, exercise regularly and maintain ideal body weight. Check f/u lipid profile. Followup in the future with my nurse practitioner Janett Billow in 6 months or call earlier if needed Greater than 50% of time during this 25 minute visit was spent on counseling,explanation of diagnosis, planning of further management, discussion with patient and family and coordination of care Antony Contras, MD Note: This document was  prepared with digital dictation and possible smart phrase technology. Any transcriptional errors that result from this process are unintentional

## 2019-01-19 NOTE — Patient Instructions (Signed)
I had a long d/w patient about his recent stroke, risk for recurrent stroke/TIAs, personally independently reviewed imaging studies and stroke evaluation results and answered questions.Continue aspirin  for secondary stroke prevention and maintain strict control of hypertension with blood pressure goal below 130/90, diabetes with hemoglobin A1c goal below 6.5% and lipids with LDL cholesterol goal below 70 mg/dL. I also advised the patient to eat a healthy diet with plenty of whole grains, cereals, fruits and vegetables, exercise regularly and maintain ideal body weight. Check f/u lipid profile. Followup in the future with my nurse practitioner Janett Billow in 6 months or call earlier if needed  Stroke Prevention Some medical conditions and behaviors are associated with a higher chance of having a stroke. You can help prevent a stroke by making nutrition, lifestyle, and other changes, including managing any medical conditions you may have. What nutrition changes can be made?   Eat healthy foods. You can do this by: ? Choosing foods high in fiber, such as fresh fruits and vegetables and whole grains. ? Eating at least 5 or more servings of fruits and vegetables a day. Try to fill half of your plate at each meal with fruits and vegetables. ? Choosing lean protein foods, such as lean cuts of meat, poultry without skin, fish, tofu, beans, and nuts. ? Eating low-fat dairy products. ? Avoiding foods that are high in salt (sodium). This can help lower blood pressure. ? Avoiding foods that have saturated fat, trans fat, and cholesterol. This can help prevent high cholesterol. ? Avoiding processed and premade foods.  Follow your health care provider's specific guidelines for losing weight, controlling high blood pressure (hypertension), lowering high cholesterol, and managing diabetes. These may include: ? Reducing your daily calorie intake. ? Limiting your daily sodium intake to 1,500 milligrams (mg). ? Using  only healthy fats for cooking, such as olive oil, canola oil, or sunflower oil. ? Counting your daily carbohydrate intake. What lifestyle changes can be made?  Maintain a healthy weight. Talk to your health care provider about your ideal weight.  Get at least 30 minutes of moderate physical activity at least 5 days a week. Moderate activity includes brisk walking, biking, and swimming.  Do not use any products that contain nicotine or tobacco, such as cigarettes and e-cigarettes. If you need help quitting, ask your health care provider. It may also be helpful to avoid exposure to secondhand smoke.  Limit alcohol intake to no more than 1 drink a day for nonpregnant women and 2 drinks a day for men. One drink equals 12 oz of beer, 5 oz of wine, or 1 oz of hard liquor.  Stop any illegal drug use.  Avoid taking birth control pills. Talk to your health care provider about the risks of taking birth control pills if: ? You are over 60 years old. ? You smoke. ? You get migraines. ? You have ever had a blood clot. What other changes can be made?  Manage your cholesterol levels. ? Eating a healthy diet is important for preventing high cholesterol. If cholesterol cannot be managed through diet alone, you may also need to take medicines. ? Take any prescribed medicines to control your cholesterol as told by your health care provider.  Manage your diabetes. ? Eating a healthy diet and exercising regularly are important parts of managing your blood sugar. If your blood sugar cannot be managed through diet and exercise, you may need to take medicines. ? Take any prescribed medicines to control your diabetes  as told by your health care provider.  Control your hypertension. ? To reduce your risk of stroke, try to keep your blood pressure below 130/80. ? Eating a healthy diet and exercising regularly are an important part of controlling your blood pressure. If your blood pressure cannot be managed  through diet and exercise, you may need to take medicines. ? Take any prescribed medicines to control hypertension as told by your health care provider. ? Ask your health care provider if you should monitor your blood pressure at home. ? Have your blood pressure checked every year, even if your blood pressure is normal. Blood pressure increases with age and some medical conditions.  Get evaluated for sleep disorders (sleep apnea). Talk to your health care provider about getting a sleep evaluation if you snore a lot or have excessive sleepiness.  Take over-the-counter and prescription medicines only as told by your health care provider. Aspirin or blood thinners (antiplatelets or anticoagulants) may be recommended to reduce your risk of forming blood clots that can lead to stroke.  Make sure that any other medical conditions you have, such as atrial fibrillation or atherosclerosis, are managed. What are the warning signs of a stroke? The warning signs of a stroke can be easily remembered as BEFAST.  B is for balance. Signs include: ? Dizziness. ? Loss of balance or coordination. ? Sudden trouble walking.  E is for eyes. Signs include: ? A sudden change in vision. ? Trouble seeing.  F is for face. Signs include: ? Sudden weakness or numbness of the face. ? The face or eyelid drooping to one side.  A is for arms. Signs include: ? Sudden weakness or numbness of the arm, usually on one side of the body.  S is for speech. Signs include: ? Trouble speaking (aphasia). ? Trouble understanding.  T is for time. ? These symptoms may represent a serious problem that is an emergency. Do not wait to see if the symptoms will go away. Get medical help right away. Call your local emergency services (911 in the [place] Do not drive yourself to the hospital.  Other signs of stroke may include: ? A sudden, severe headache with no known cause. ? Nausea or vomiting. ? Seizure. Where to find more  information For more information, visit:  American Stroke Association: www.strokeassociation.org  National Stroke Association: www.stroke.org Summary  You can prevent a stroke by eating healthy, exercising, not smoking, limiting alcohol intake, and managing any medical conditions you may have.  Do not use any products that contain nicotine or tobacco, such as cigarettes and e-cigarettes. If you need help quitting, ask your health care provider. It may also be helpful to avoid exposure to secondhand smoke.  Remember BEFAST for warning signs of stroke. Get help right away if you or a loved one has any of these signs. This information is not intended to replace advice given to you by your health care provider. Make sure you discuss any questions you have with your health care provider. Document Released: 09/11/2004 Document Revised: 09/09/2016 Document Reviewed: 09/09/2016 Elsevier Interactive Patient Education  2019 [employer].

## 2019-01-20 LAB — LIPID PANEL
Chol/HDL Ratio: 3.1 ratio (ref 0.0–5.0)
Cholesterol, Total: 136 mg/dL (ref 100–199)
HDL: 44 mg/dL (ref >39)
LDL Calculated: 70 mg/dL (ref 0–99)
Triglycerides: 110 mg/dL (ref 0–149)
VLDL Cholesterol Cal: 22 mg/dL (ref 5–40)

## 2019-03-03 ENCOUNTER — Other Ambulatory Visit: Payer: Self-pay

## 2019-03-03 NOTE — Patient Outreach (Signed)
First attempt to obtain mRs. No answer. Left message for return call.  

## 2019-03-10 ENCOUNTER — Other Ambulatory Visit: Payer: Self-pay

## 2019-03-10 NOTE — Patient Outreach (Signed)
Second attempt to obtain mRs. No answer. Left message for return call.  

## 2019-03-16 ENCOUNTER — Other Ambulatory Visit: Payer: Self-pay

## 2019-03-16 NOTE — Patient Outreach (Signed)
Telephone outreach to patient to obtain mRs was successfully completed. mRs= 2. 

## 2019-04-07 ENCOUNTER — Other Ambulatory Visit: Payer: Self-pay

## 2019-04-07 DIAGNOSIS — Z20822 Contact with and (suspected) exposure to covid-19: Secondary | ICD-10-CM

## 2019-04-08 LAB — NOVEL CORONAVIRUS, NAA: SARS-CoV-2, NAA: NOT DETECTED

## 2019-07-01 ENCOUNTER — Other Ambulatory Visit: Payer: Self-pay

## 2019-07-01 DIAGNOSIS — Z20822 Contact with and (suspected) exposure to covid-19: Secondary | ICD-10-CM

## 2019-07-04 LAB — NOVEL CORONAVIRUS, NAA: SARS-CoV-2, NAA: NOT DETECTED

## 2019-07-21 ENCOUNTER — Encounter: Payer: Self-pay | Admitting: Neurology

## 2019-07-21 ENCOUNTER — Other Ambulatory Visit: Payer: Self-pay

## 2019-07-21 ENCOUNTER — Ambulatory Visit (INDEPENDENT_AMBULATORY_CARE_PROVIDER_SITE_OTHER): Payer: Medicare HMO | Admitting: Neurology

## 2019-07-21 VITALS — BP 130/80 | HR 65 | Temp 97.4°F | Ht 72.0 in | Wt 215.4 lb

## 2019-07-21 DIAGNOSIS — R202 Paresthesia of skin: Secondary | ICD-10-CM

## 2019-07-21 NOTE — Progress Notes (Signed)
[facility] [address] [place]. [place] [address] [phone]       OFFICE FOLLOW-UP NOTE  [name] Date of Birth:  1951-02-13 Medical Record Number:  [id number]   HPI: [name] is a 74-year African-American male seen today for initial office follow-up visit following hospital admission for stroke in April 2020.  History is obtained from the patient and review of electronic medical records.  I personally reviewed imaging films. Prudy Feeler a 68 y.o.malewith history of kidney stones, hypertension, hypercholesterolemia.Patient states that he got up around 6 AM this morning 12/01/2018 without symptoms. He was making a pot of coffee and took his blood pressure medications. At approximately 7:00 (LKW 715) he developed numbness that went from his leg up to his arm which then proceeded to include his face with significant left leg weakness. He denies any other symptoms. He did call EMS which brought him to [facility]. Patient was administered TPA there and sent to [facility] for further monitoring. Upon arrival patient had significantly improved. The only findings were a left facial droop. Patient feels as though he is back to normal. He denies taking aspirin on a daily basis. Premorbid modified Rankin scale (mRS):0. NIH stroke score:1. CT scan of the bed brain showed no acute abnormality.  MRI scan showed a small right thalamic lacunar infarct.  MRI of the brain showed no significant large vessel intracranial stenosis.  Carotid ultrasound showed no significant extracranial stenosis.  LDL cholesterol was elevated at 103 mg percent.  Hemoglobin A1c was 5.4.  Patient was started on aspirin 81 and Plavix 75 mg daily for 3 weeks followed by aspirin alone.  He states is done well since discharge.  He still gets occasional intermittent left leg numbness but this is transient and appears to be more prominent when he is tired or exhausted.  He has had no recurrent  stroke or TIA symptoms.  He states his blood pressure is well controlled.  It is 147/81 today in our office.  The patient states that interestingly he had removed several ticks from his legs few days prior to his stroke symptoms.  He however did not have the classical erythematous rash or arthralgias from tick bite.  Patient states is tolerating aspirin well without bruising or bleeding.  Is also tolerating Lipitor well without muscle aches and pains.  He is quite physically active and exercises every day.  He is eating healthy and has not gained any weight.  The patient has been dipping snuff but he knows that he has to quit that. Update 07/21/2019 ; he returns for follow-up after last visit 6 months ago.  Continues to do well without recurrent stroke or TIA symptoms.  He still has intermittent paresthesias in his left leg but these are not as disabling and appear to be improving compared to last visit.  He is pretty active and is able to do almost everything that he did before the stroke.  He remains on aspirin which is tolerating well without bleeding or bruising.  Blood pressures well controlled and today it is 130/80.  He remains on Lipitor which is tolerating well without muscle aches and pain.  Follow-up LDL cholesterol on 01/19/2019 was 70 mg percent.  He has no new complaints today. ROS:   14 system review of systems is positive for numbness and tingling only and all other systems negative  PMH:  Past Medical History:  Diagnosis Date  . High cholesterol   .  Hypertension   . Kidney stones   . Stroke [facility]) 12/01/2018    Social History:  Social History   Socioeconomic History  . Marital status: Married    Spouse name: Ralph Martinez  . Number of children: 2  . Years of education: Not on file  . Highest education level: High school graduate  Occupational History    Comment: retired  Scientific laboratory technician  . Financial resource strain: Not on file  . Food insecurity    Worry: Not on file    Inability:  Not on file  . Transportation needs    Medical: Not on file    Non-medical: Not on file  Tobacco Use  . Smoking status: Former Smoker    Packs/day: 0.08    Types: Cigarettes    Quit date: 07/22/1978    Years since quitting: 41.0  . Smokeless tobacco: Former Network engineer and Sexual Activity  . Alcohol use: Yes    Comment: occasional  . Drug use: No  . Sexual activity: Not on file  Lifestyle  . Physical activity    Days per week: Not on file    Minutes per session: Not on file  . Stress: Not on file  Relationships  . Social Herbalist on phone: Not on file    Gets together: Not on file    Attends religious service: Not on file    Active member of club or organization: Not on file    Attends meetings of clubs or organizations: Not on file    Relationship status: Not on file  . Intimate partner violence    Fear of current or ex partner: Not on file    Emotionally abused: Not on file    Physically abused: Not on file    Forced sexual activity: Not on file  Other Topics Concern  . Not on file  Social History Narrative   Lives with wife and daughter   Caffeine- coffee 1 cup, 1 soda a day, green tea 1 glass    Medications:   Current Outpatient Medications on File Prior to Visit  Medication Sig Dispense Refill  . aspirin EC 81 MG EC tablet Take 1 tablet (81 mg total) by mouth daily.    Marland Kitchen atorvastatin (LIPITOR) 40 MG tablet Take 1 tablet (40 mg total) by mouth daily at 6 PM. 30 tablet 2  . polyvinyl alcohol (LIQUIFILM TEARS) 1.4 % ophthalmic solution Place 2 drops into both eyes as needed for dry eyes.    . pravastatin (PRAVACHOL) 20 MG tablet TAKE 1 TABLET BY MOUTH EVERYDAY AT BEDTIME    . valsartan (DIOVAN) 320 MG tablet Take 320 mg by mouth daily.     No current facility-administered medications on file prior to visit.     Allergies:   Allergies  Allergen Reactions  . Codeine Hives  . Erythromycin Hives and Itching    Physical Exam General: Frail  middle-aged African-American male, seated, in no evident distress Head: head normocephalic and atraumatic.  Neck: supple with no carotid or supraclavicular bruits Cardiovascular: regular rate and rhythm, no murmurs Musculoskeletal: no deformity Skin:  no rash/petichiae Vascular:  Normal pulses all extremities Vitals:   07/21/19 0954  BP: 130/80  Pulse: 65  Temp: (!) 97.4 F (36.3 C)   Neurologic Exam Mental Status: Awake and fully alert. Oriented to place and time. Recent and remote memory intact. Attention span, concentration and fund of knowledge appropriate. Mood and affect appropriate.  Cranial Nerves: Fundoscopic exam  not done Pupils equal, briskly reactive to light. Extraocular movements full without nystagmus. Visual fields full to confrontation. Hearing intact. Facial sensation intact. Face, tongue, palate moves normally and symmetrically.  Motor: Normal bulk and tone. Normal strength in all tested extremity muscles. Sensory.: intact to touch ,pinprick .position and vibratory sensation.  Coordination: Rapid alternating movements normal in all extremities. Finger-to-nose and heel-to-shin performed accurately bilaterally. Gait and Station: Arises from chair without difficulty. Stance is normal. Gait demonstrates normal stride length and balance . Not able to heel, toe and tandem walk without difficulty.  Reflexes: 1+ and symmetric. Toes downgoing.       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  He is doing well except for mild post stroke paresthesias which appear to be improving..  Vascular risk factors of hypertension hyperlipidemia and age only.    PLAN: I had a long d/w patient about his recent stroke,post stroke paresthesias, risk for recurrent stroke/TIAs, personally independently reviewed imaging studies and stroke evaluation results and answered questions.Continue aspirin 81  mg daily  for secondary stroke prevention and maintain strict control of hypertension with blood pressure goal below 130/90, diabetes with hemoglobin A1c goal below 6.5% and lipids with LDL cholesterol goal below 70 mg/dL. I also advised the patient to eat a healthy diet with plenty of whole grains, cereals, fruits and vegetables, exercise regularly and maintain ideal body weight.  Check screening follow-up carotid ultrasound study.  Patient's post stroke paresthesias appear to be improving and are not disabling enough at the present time to justify medications.  Followup in the future with me in 1 year or call earlier if necessary.needed Greater than 50% of time during this 25 minute visit was spent on counseling,explanation of diagnosis, planning of further management, discussion with patient and family and coordination of care Antony Contras, MD Note: This document was prepared with digital dictation and possible smart phrase technology. Any transcriptional errors that result from this process are unintentional

## 2019-07-21 NOTE — Patient Instructions (Signed)
I had a long d/w patient about his recent stroke,post stroke paresthesias, risk for recurrent stroke/TIAs, personally independently reviewed imaging studies and stroke evaluation results and answered questions.Continue aspirin 81 mg daily  for secondary stroke prevention and maintain strict control of hypertension with blood pressure goal below 130/90, diabetes with hemoglobin A1c goal below 6.5% and lipids with LDL cholesterol goal below 70 mg/dL. I also advised the patient to eat a healthy diet with plenty of whole grains, cereals, fruits and vegetables, exercise regularly and maintain ideal body weight.  Check screening follow-up carotid ultrasound study.  Patient's post stroke paresthesias appear to be improving and are not disabling enough at the present time to justify medications.  Followup in the future with me in 1 year or call earlier if necessary.

## 2019-08-01 ENCOUNTER — Telehealth: Payer: Self-pay | Admitting: Neurology

## 2019-08-01 DIAGNOSIS — R202 Paresthesia of skin: Secondary | ICD-10-CM

## 2019-08-01 NOTE — Telephone Encounter (Signed)
Per Forestine Na . Order needs Img -1202 Please reorder and I will scheduled.

## 2019-08-01 NOTE — Telephone Encounter (Signed)
I spoke with patient doppler is scheduled 08/04/2019 at [facility].

## 2019-08-01 NOTE — Telephone Encounter (Signed)
Order has been placed.

## 2019-08-01 NOTE — Addendum Note (Signed)
Addended by: Verlin Grills T on: 08/01/2019 01:09 PM   Modules accepted: Orders

## 2019-08-04 ENCOUNTER — Other Ambulatory Visit: Payer: Self-pay

## 2019-08-04 ENCOUNTER — Ambulatory Visit (HOSPITAL_COMMUNITY)
Admission: RE | Admit: 2019-08-04 | Discharge: 2019-08-04 | Disposition: A | Discharge status: Home / Self Care | Payer: Medicare HMO | Source: Ambulatory Visit | Attending: Neurology | Admitting: Neurology

## 2019-08-04 DIAGNOSIS — R202 Paresthesia of skin: Secondary | ICD-10-CM

## 2020-02-06 ENCOUNTER — Telehealth: Payer: Self-pay | Admitting: Orthopedic Surgery

## 2020-02-06 ENCOUNTER — Ambulatory Visit
Admission: EM | Admit: 2020-02-06 | Discharge: 2020-02-06 | Disposition: A | Discharge status: Home / Self Care | Payer: Medicare HMO | Attending: Emergency Medicine | Admitting: Emergency Medicine

## 2020-02-06 ENCOUNTER — Ambulatory Visit (INDEPENDENT_AMBULATORY_CARE_PROVIDER_SITE_OTHER): Payer: Medicare HMO

## 2020-02-06 ENCOUNTER — Other Ambulatory Visit: Payer: Self-pay

## 2020-02-06 ENCOUNTER — Encounter: Payer: Self-pay | Admitting: Emergency Medicine

## 2020-02-06 DIAGNOSIS — M25472 Effusion, left ankle: Secondary | ICD-10-CM

## 2020-02-06 DIAGNOSIS — M25572 Pain in left ankle and joints of left foot: Secondary | ICD-10-CM

## 2020-02-06 DIAGNOSIS — S93492A Sprain of other ligament of left ankle, initial encounter: Secondary | ICD-10-CM

## 2020-02-06 MED ORDER — PREDNISONE 20 MG PO TABS: 20.0000 mg | ORAL_TABLET | Freq: Two times a day (BID) | ORAL | 0 refills | Status: AC

## 2020-02-06 NOTE — ED Triage Notes (Addendum)
Pt twisted his LT ankle x 2 [place].  Ankle is swollen and painful, pt limping while ambulated.

## 2020-02-06 NOTE — Telephone Encounter (Signed)
Patient's daughter called this morning stating her father Ralph Martinez has an ankle that has a knot on it and it is swollen.  She wanted to get an appointment for today.  I explained to her that we didn't have an opening for today but that I could offer an appointment with Dr. Luna Glasgow tomorrow morning.  She said they would check with the PCP and then call back to see about scheduling.

## 2020-02-06 NOTE — Discharge Instructions (Signed)
X-rays negative for fracture or dislocation Ace applied Continue conservative management of rest, ice, and elevation Prednisone prescribed.  Take as directed and to completion Use OTC tylenol as well for pain Follow up with PCP next week for recheck and to ensure symptoms are improving Return or go to the ER if you have any new or worsening symptoms (fever, chills, chest pain, swelling, worsening symptoms despite treatment etc...)

## 2020-02-06 NOTE — ED Provider Notes (Signed)
[place]   [id number] 02/06/20 Arrival Time: 69  CC: LT ankle PAIN  SUBJECTIVE: History from: patient. Ralph Martinez is a 69 y.o. male complains of LT ankle pain x 3 days.  Symptoms began after jumping over waves in the ocean.  Localizes the pain to the outside of ankle.  Describes the pain as intermittent.  Has tried OTC medications without relief.  Symptoms are made worse with walking.  Denies similar symptoms in the past.  Complains of associated swelling.  Denies fever, chills, erythema, ecchymosis, weakness, numbness and tingling.  ROS: As per HPI.  All other pertinent ROS negative.     Past Medical History:  Diagnosis Date  . High cholesterol   . Hypertension   . Kidney stones   . Stroke [facility]) 12/01/2018   Past Surgical History:  Procedure Laterality Date  . CHOLECYSTECTOMY N/A 07/14/2016   Procedure: LAPAROSCOPIC CHOLECYSTECTOMY;  Surgeon: Vickie Epley, MD;  Location: [facility];  Service: General;  Laterality: N/A;  . KIDNEY STONE SURGERY     Allergies  Allergen Reactions  . Codeine Hives  . Erythromycin Hives and Itching   No current facility-administered medications on file prior to encounter.   Current Outpatient Medications on File Prior to Encounter  Medication Sig Dispense Refill  . aspirin EC 81 MG EC tablet Take 1 tablet (81 mg total) by mouth daily.    Marland Kitchen atorvastatin (LIPITOR) 40 MG tablet Take 1 tablet (40 mg total) by mouth daily at 6 PM. 30 tablet 2  . pravastatin (PRAVACHOL) 20 MG tablet TAKE 1 TABLET BY MOUTH EVERYDAY AT BEDTIME    . valsartan (DIOVAN) 320 MG tablet Take 320 mg by mouth daily.     Social History   Socioeconomic History  . Marital status: Married    Spouse name: [name]  . Number of children: 2  . Years of education: Not on file  . Highest education level: High school graduate  Occupational History    Comment: retired  Tobacco Use  . Smoking status: Former Smoker    Packs/day: 0.08    Types: Cigarettes    Quit  date: 07/22/1978    Years since quitting: 41.5  . Smokeless tobacco: Former Network engineer and Sexual Activity  . Alcohol use: Yes    Comment: occasional  . Drug use: No  . Sexual activity: Not on file  Other Topics Concern  . Not on file  Social History Narrative   Lives with wife and daughter   Caffeine- coffee 1 cup, 1 soda a day, green tea 1 glass   Social Determinants of Health   Financial Resource Strain:   . Difficulty of Paying Living Expenses:   Food Insecurity:   . Worried About Charity fundraiser in the Last Year:   . Arboriculturist in the Last Year:   Transportation Needs:   . Film/video editor (Medical):   Marland Kitchen Lack of Transportation (Non-Medical):   Physical Activity:   . Days of Exercise per Week:   . Minutes of Exercise per Session:   Stress:   . Feeling of Stress :   Social Connections:   . Frequency of Communication with Friends and Family:   . Frequency of Social Gatherings with Friends and Family:   . Attends Religious Services:   . Active Member of Clubs or Organizations:   . Attends Archivist Meetings:   Marland Kitchen Marital Status:   Intimate Partner Violence:   . Fear  of Current or Ex-Partner:   . Emotionally Abused:   Marland Kitchen Physically Abused:   . Sexually Abused:    Family History  Problem Relation Age of Onset  . Hypertension Mother   . Hypertension Father   . Rheum arthritis Sister     OBJECTIVE:  Vitals:   02/06/20 1340 02/06/20 1342  BP:  129/73  Pulse:  63  Resp:  19  Temp:  97.6 F (36.4 C)  TempSrc:  Oral  SpO2:  94%  Weight: 213 lb 13.5 oz (97 kg)   Height: 6' (1.829 m)     General appearance: ALERT; in no acute distress.  Head: NCAT Lungs: Normal respiratory effort CV: Dorsalis pedis pulses 2+ . Cap refill < 2 seconds Musculoskeletal: LT ankle Inspection: Swelling diffuse about medial and lateral ankle Palpation: TTP over ATFL, no bony tenderness ROM: FROM active and passive Strength: 5/5 dorsiflexion, 5/5  plantar flexion Skin: warm and dry Neurologic: Ambulates without difficulty; Sensation intact about the upper/ lower extremities Psychological: alert and cooperative; normal mood and affect  DIAGNOSTIC STUDIES:  DG Ankle Complete Left  Result Date: 02/06/2020 CLINICAL DATA:  Left ankle pain following strenuous exercise EXAM: LEFT ANKLE COMPLETE - 3+ VIEW COMPARISON:  None. FINDINGS: Mild soft tissue swelling is noted about the ankle. No acute fracture or dislocation is noted. Calcaneal spurring is seen. IMPRESSION: Soft tissue swelling without acute bony abnormality. Electronically Signed   By: Inez Catalina M.D.   On: 02/06/2020 14:16    X-rays negative for bony abnormalities including fracture, or dislocation.    I have reviewed the x-rays myself and the radiologist interpretation. I am in agreement with the radiologist interpretation.     ASSESSMENT & PLAN:  1. Acute left ankle pain   2. Left ankle swelling   3. Sprain of anterior talofibular ligament of left ankle, initial encounter     Meds ordered this encounter  Medications  . predniSONE (DELTASONE) 20 MG tablet    Sig: Take 1 tablet (20 mg total) by mouth 2 (two) times daily with a meal for 5 days.    Dispense:  10 tablet    Refill:  0    Order Specific Question:   Supervising Provider    Answer:   Raylene Everts [[id number]]   X-rays negative for fracture or dislocation Ace applied Continue conservative management of rest, ice, and elevation Prednisone prescribed.  Take as directed and to completion Use OTC tylenol as well for pain Follow up with PCP next week for recheck and to ensure symptoms are improving Return or go to the ER if you have any new or worsening symptoms (fever, chills, chest pain, swelling, worsening symptoms despite treatment etc...)   Reviewed expectations re: course of current medical issues. Questions answered. Outlined signs and symptoms indicating need for more acute intervention. Patient  verbalized understanding. After Visit Summary given.    Lestine Box, PA-C 02/06/20 1433

## 2020-06-30 DIAGNOSIS — U071 COVID-19: Secondary | ICD-10-CM

## 2020-06-30 HISTORY — DX: COVID-19: U07.1

## 2020-07-30 ENCOUNTER — Telehealth: Payer: Self-pay | Admitting: Neurology

## 2020-07-30 ENCOUNTER — Ambulatory Visit: Payer: Medicare HMO | Admitting: Neurology

## 2020-07-30 ENCOUNTER — Encounter: Payer: Self-pay | Admitting: Neurology

## 2020-07-30 VITALS — BP 139/81 | HR 79 | Ht 72.0 in | Wt 212.0 lb

## 2020-07-30 DIAGNOSIS — G3184 Mild cognitive impairment, so stated: Secondary | ICD-10-CM

## 2020-07-30 DIAGNOSIS — I6990 Unspecified sequelae of unspecified cerebrovascular disease: Secondary | ICD-10-CM

## 2020-07-30 HISTORY — DX: Mild cognitive impairment of uncertain or unknown etiology: G31.84

## 2020-07-30 MED ORDER — CEREFOLIN 6-1-50-5 MG PO TABS: 1.0000 | ORAL_TABLET | Freq: Every morning | ORAL | 3 refills | Status: DC

## 2020-07-30 NOTE — Patient Instructions (Signed)
I had a long d/w patient about his remote stroke,post stroke paresthesias, risk for recurrent stroke/TIAs, personally independently reviewed imaging studies and stroke evaluation results and answered questions.Continue aspirin 81 mg daily  for secondary stroke prevention and maintain strict control of hypertension with blood pressure goal below 130/90, diabetes with hemoglobin A1c goal below 6.5% and lipids with LDL cholesterol goal below 70 mg/dL. I also advised the patient to eat a healthy diet with plenty of whole grains, cereals, fruits and vegetables, exercise regularly and maintain ideal body weight.  We also discussed his mild memory and cognitive difficulties which are likely due to mild cognitive impairment.  We discussed memory compensation strategies.  I recommend he start taking Cerefolin NAC 1 tablet daily for memory enhancement as well as increase participation in cognitively challenging activities like solving crossword puzzles, playing bridge and sodoku.  Marland Kitchen  He will return for follow-up in the future in 3 months with my nurse practitioner call earlier if necessary. Memory Compensation Strategies  1. Use "WARM" strategy.  W= write it down  A= associate it  R= repeat it  M= make a mental note  2.   You can keep a Social worker.  Use a 3-ring notebook with sections for the following: calendar, important names and phone numbers,  medications, doctors' names/phone numbers, lists/reminders, and a section to journal what you did  each day.   3.    Use a calendar to write appointments down.  4.    Write yourself a schedule for the day.  This can be placed on the calendar or in a separate section of the Memory Notebook.  Keeping a  regular schedule can help memory.  5.    Use medication organizer with sections for each day or morning/evening pills.  You may need help loading it  6.    Keep a basket, or pegboard by the door.  Place items that you need to take out with you in the basket  or on the pegboard.  You may also want to  include a message board for reminders.  7.    Use sticky notes.  Place sticky notes with reminders in a place where the task is performed.  For example: " turn off the  stove" placed by the stove, "lock the door" placed on the door at eye level, " take your medications" on  the bathroom mirror or by the place where you normally take your medications.  8.    Use alarms/timers.  Use while cooking to remind yourself to check on food or as a reminder to take your medicine, or as a  reminder to make a call, or as a reminder to perform another task, etc.

## 2020-07-30 NOTE — Progress Notes (Signed)
[facility] [address] [place]. [place] [address] [phone]       OFFICE FOLLOW-UP NOTE  [name] Date of Birth:  [birth year] Medical Record Number:  [id number]   HPI: Ralph Martinez is a 69-year African-American male seen today for initial office follow-up visit following hospital admission for stroke in April 2020.  History is obtained from the patient and review of electronic medical records.  I personally reviewed imaging films. Ralph Martinez a 69 y.o.malewith history of kidney stones, hypertension, hypercholesterolemia.Patient states that he got up around 6 AM this morning 12/01/2018 without symptoms. He was making a pot of coffee and took his blood pressure medications. At approximately 7:00 (LKW 715) he developed numbness that went from his leg up to his arm which then proceeded to include his face with significant left leg weakness. He denies any other symptoms. He did call EMS which brought him to [facility]. Patient was administered TPA there and sent to [facility] for further monitoring. Upon arrival patient had significantly improved. The only findings were a left facial droop. Patient feels as though he is back to normal. He denies taking aspirin on a daily basis. Premorbid modified Rankin scale (mRS):0. NIH stroke score:1. CT scan of the bed brain showed no acute abnormality.  MRI scan showed a small right thalamic lacunar infarct.  MRI of the brain showed no significant large vessel intracranial stenosis.  Carotid ultrasound showed no significant extracranial stenosis.  LDL cholesterol was elevated at 103 mg percent.  Hemoglobin A1c was 5.4.  Patient was started on aspirin 81 and Plavix 75 mg daily for 3 weeks followed by aspirin alone.  He states is done well since discharge.  He still gets occasional intermittent left leg numbness but this is transient and appears to be more prominent when he is tired or exhausted.  He has had no recurrent  stroke or TIA symptoms.  He states his blood pressure is well controlled.  It is 147/81 today in our office.  The patient states that interestingly he had removed several ticks from his legs few days prior to his stroke symptoms.  He however did not have the classical erythematous rash or arthralgias from tick bite.  Patient states is tolerating aspirin well without bruising or bleeding.  Is also tolerating Lipitor well without muscle aches and pains.  He is quite physically active and exercises every day.  He is eating healthy and has not gained any weight.  The patient has been dipping snuff but he knows that he has to quit that. Update 07/21/2019 ; he returns for follow-up after last visit 6 months ago.  Continues to do well without recurrent stroke or TIA symptoms.  He still has intermittent paresthesias in his left leg but these are not as disabling and appear to be improving compared to last visit.  He is pretty active and is able to do almost everything that he did before the stroke.  He remains on aspirin which is tolerating well without bleeding or bruising.  Blood pressures well controlled and today it is 130/80.  He remains on Lipitor which is tolerating well without muscle aches and pain.  Follow-up LDL cholesterol on 01/19/2019 was 70 mg percent.  He has no new complaints today. Update 07/30/2020 : He returns for follow-up after last visit a year ago.  The patient is alone but he is accompanied by his wife whom he did not permit to come into the room with  her.  However she slipped me is note stating that she is concerned that patient has been having some paranoia recently thinks people are watching the house and yelling at him.  He is having some bad mood swings as well gets angry over little things.  He has trouble remembering recent recent information and our 2-hour he often loses things.  Wife is concerned about dementia.  She also feels that he stumbles a lot.  Patient himself denies any symptoms.   He denies any new stroke or TIA-like symptoms.  On the Mini-Mental status exam he scored 26 with only deficits in attention calculation.  On depression scale he was not found to be depressed.  His is tolerating aspirin well without bruising bleeding.  Blood pressures well controlled and today it is 139/81.  Is remains on Lipitor which is tolerating well without muscle aches and pains.  He has an upcoming visit with his primary care physician next month for annual physical and will have lab work done.  His last carotid ultrasound was done on 08/04/2019 was unremarkable. ROS:   14 system review of systems is positive for numbness and tingling, paranoia, irritability, memory difficulties, mood swings, getting angry, memory difficulties, stumbling only and all other systems negative  PMH:  Past Medical History:  Diagnosis Date   High cholesterol    Hypertension    Kidney stones    Stroke ([place]) 12/01/2018    Social History:  Social History   Socioeconomic History   Marital status: Married    Spouse name: Ralph Martinez   Number of children: 2   Years of education: Not on file   Highest education level: High school graduate  Occupational History   Occupation: retired    Comment: retired  Tobacco Use   Smoking status: Former Smoker    Packs/day: 0.08    Types: Cigarettes    Quit date: 07/22/1978    Years since quitting: 42.0   Smokeless tobacco: Former Network engineer and Sexual Activity   Alcohol use: Yes    Comment: occasional   Drug use: No   Sexual activity: Not on file  Other Topics Concern   Not on file  Social History Narrative   Lives with wife and daughter   Caffeine- coffee 1 cup, 1 soda a day, green tea 1 glass   Social Determinants of Radio broadcast assistant Strain: Not on file  Food Insecurity: Not on file  Transportation Needs: Not on file  Physical Activity: Not on file  Stress: Not on file  Social Connections: Not on file  Intimate Partner  Violence: Not on file    Medications:   Current Outpatient Medications on File Prior to Visit  Medication Sig Dispense Refill   aspirin EC 81 MG EC tablet Take 1 tablet (81 mg total) by mouth daily.     pravastatin (PRAVACHOL) 20 MG tablet TAKE 1 TABLET BY MOUTH EVERYDAY AT BEDTIME     valsartan (DIOVAN) 320 MG tablet Take 320 mg by mouth daily.     atorvastatin (LIPITOR) 40 MG tablet Take 1 tablet (40 mg total) by mouth daily at 6 PM. 30 tablet 2   No current facility-administered medications on file prior to visit.    Allergies:   Allergies  Allergen Reactions   Codeine Hives   Erythromycin Hives and Itching    Physical Exam General: Frail middle-aged African-American male, seated, in no evident distress Head: head normocephalic and atraumatic.  Neck: supple with no carotid or  supraclavicular bruits Cardiovascular: regular rate and rhythm, no murmurs Musculoskeletal: no deformity Skin:  no rash/petichiae Vascular:  Normal pulses all extremities Vitals:   07/30/20 0814  BP: 139/81  Pulse: 79   Neurologic Exam Mental Status: Awake and fully alert. Oriented to place and time. Recent and remote memory intact. Attention span, concentration and fund of knowledge appropriate. Mood and affect appropriate.  Diminished recall 2/3.  Able to name 13 animals which can walk on 4 legs.  Clock drawing 3/4.  Mini-Mental status exam score 26/30 with deficits mostly inattention and recall.  He had difficulty in copying intersecting pentagons. Cranial Nerves: Fundoscopic exam not done Pupils equal, briskly reactive to light. Extraocular movements full without nystagmus. Visual fields full to confrontation. Hearing intact. Facial sensation intact. Face, tongue, palate moves normally and symmetrically.  Motor: Normal bulk and tone. Normal strength in all tested extremity muscles. Sensory.: intact to touch ,pinprick .position and vibratory sensation.  But subjective paresthesias in the left  leg lateral aspect. Coordination: Rapid alternating movements normal in all extremities. Finger-to-nose and heel-to-shin performed accurately bilaterally. Gait and Station: Arises from chair without difficulty. Stance is normal. Gait demonstrates normal stride length and balance . Not able to heel, toe and tandem walk without difficulty.  Reflexes: 1+ and symmetric. Toes downgoing.       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  He is doing well except for mild post stroke paresthesias which appear to be improving..  Vascular risk factors of hypertension hyperlipidemia and age only.  New complaints of memory and cognitive impairment and personality change likely due to mild cognitive impairment.    PLAN: I had a long d/w patient about his remote stroke,post stroke paresthesias, risk for recurrent stroke/TIAs, personally independently reviewed imaging studies and stroke evaluation results and answered questions.Continue aspirin 81 mg daily  for secondary stroke prevention and maintain strict control of hypertension with blood pressure goal below 130/90, diabetes with hemoglobin A1c goal below 6.5% and lipids with LDL cholesterol goal below 70 mg/dL. I also advised the patient to eat a healthy diet with plenty of whole grains, cereals, fruits and vegetables, exercise regularly and maintain ideal body weight.  We also discussed his mild memory and cognitive difficulties which are likely due to mild cognitive impairment.  We discussed memory compensation strategies.  I recommend he start taking Cerefolin NAC 1 tablet daily for memory enhancement as well as increase participation in cognitively challenging activities like solving crossword puzzles, playing bridge and sodoku.  Greater than 50% of time during this prolonged 40 minute visit was spent on counseling,explanation of diagnosis of mild cognitive  imairment, planning of further management, discussion with patient and family and coordination of care.  He will return for follow-up in the future in 3 months with my nurse practitioner call earlier if necessary. Antony Contras, MD Note: This document was prepared with digital dictation and possible smart phrase technology. Any transcriptional errors that result from this process are unintentional

## 2020-07-30 NOTE — Telephone Encounter (Signed)
[employer] medicare order sent to [facility]. They will obtain the auth and reach out to the patient to schedule.  °

## 2020-08-01 LAB — DEMENTIA PANEL
Homocysteine: 11.3 umol/L (ref 0.0–17.2)
RPR Ser Ql: NONREACTIVE
TSH: 3.37 u[IU]/mL (ref 0.450–4.500)
Vitamin B-12: 828 pg/mL (ref 232–1245)

## 2020-08-09 ENCOUNTER — Encounter (INDEPENDENT_AMBULATORY_CARE_PROVIDER_SITE_OTHER): Payer: Self-pay | Admitting: *Deleted

## 2020-08-15 ENCOUNTER — Encounter: Payer: Self-pay | Admitting: *Deleted

## 2020-08-15 NOTE — Progress Notes (Signed)
Kindly inform the patient that lab work for reversible causes of memory loss was all normal

## 2020-08-18 DIAGNOSIS — C4491 Basal cell carcinoma of skin, unspecified: Secondary | ICD-10-CM

## 2020-08-18 HISTORY — DX: Basal cell carcinoma of skin, unspecified: C44.91

## 2020-08-25 ENCOUNTER — Other Ambulatory Visit: Payer: Self-pay

## 2020-08-25 ENCOUNTER — Ambulatory Visit
Admission: EM | Admit: 2020-08-25 | Discharge: 2020-08-25 | Disposition: A | Discharge status: Home / Self Care | Payer: Medicare HMO | Attending: Family Medicine | Admitting: Family Medicine

## 2020-08-25 DIAGNOSIS — R21 Rash and other nonspecific skin eruption: Secondary | ICD-10-CM

## 2020-08-25 DIAGNOSIS — L299 Pruritus, unspecified: Secondary | ICD-10-CM

## 2020-08-25 MED ORDER — DEXAMETHASONE SODIUM PHOSPHATE 10 MG/ML IJ SOLN
10.0000 mg | Freq: Once | INTRAMUSCULAR | Status: AC
  Administered 2020-08-25: 10 mg via INTRAMUSCULAR

## 2020-08-25 NOTE — Discharge Instructions (Addendum)
May use OTC cortisone cream to the area  Received Decadron 10mg  IM in office today for itching  Follow up with this office or with primary care if symptoms are persisting.  Follow up in the ER for high fever, trouble swallowing, trouble breathing, other concerning symptoms.

## 2020-08-25 NOTE — ED Triage Notes (Signed)
Pt presents with rash on forehead that began today , believes to be shingles

## 2020-08-25 NOTE — ED Provider Notes (Signed)
[facility]   [id number] 08/25/20 Arrival Time: 0973  CC: RASH  SUBJECTIVE:  Ralph Martinez is a 70 y.o. male who presents with a skin complaint that began x 2 days ago. Reports red, itchy rash to his forehead. Reports that he has been using calamine lotion to the area with no relief. Denies precipitating event or trauma.  Denies changes in soaps, detergents, close contacts with similar rash, known trigger or environmental trigger, allergy. Denies medications change or starting a new medication recently. There are no aggravating or alleviating factors. Denies similar symptoms in the past.  Denies fever, chills, nausea, vomiting, swelling, discharge, oral lesions, SOB, chest pain, abdominal pain, changes in bowel or bladder function.    ROS: As per HPI.  All other pertinent ROS negative.     Past Medical History:  Diagnosis Date  . High cholesterol   . Hypertension   . Kidney stones   . Stroke [facility]) 12/01/2018   Past Surgical History:  Procedure Laterality Date  . CHOLECYSTECTOMY N/A 07/14/2016   Procedure: LAPAROSCOPIC CHOLECYSTECTOMY;  Surgeon: Vickie Epley, MD;  Location: [facility] ORS;  Service: General;  Laterality: N/A;  . KIDNEY STONE SURGERY     Allergies  Allergen Reactions  . Codeine Hives  . Erythromycin Hives and Itching   No current facility-administered medications on file prior to encounter.   Current Outpatient Medications on File Prior to Encounter  Medication Sig Dispense Refill  . aspirin EC 81 MG EC tablet Take 1 tablet (81 mg total) by mouth daily.    Marland Kitchen atorvastatin (LIPITOR) 40 MG tablet Take 1 tablet (40 mg total) by mouth daily at 6 PM. 30 tablet 2  . L-Methylfolate-B12-B6-B2 (CEREFOLIN) 01-16-49-5 MG TABS Take 1 tablet by mouth every morning. 90 tablet 3  . valsartan (DIOVAN) 320 MG tablet Take 320 mg by mouth daily.     Social History   Socioeconomic History  . Marital status: Married    Spouse name: [name]  . Number of children: 2  . Years of  education: Not on file  . Highest education level: High school graduate  Occupational History  . Occupation: retired    Comment: retired  Tobacco Use  . Smoking status: Former Smoker    Packs/day: 0.08    Types: Cigarettes    Quit date: 07/22/1978    Years since quitting: 42.1  . Smokeless tobacco: Former Network engineer and Sexual Activity  . Alcohol use: Yes    Comment: occasional  . Drug use: No  . Sexual activity: Not on file  Other Topics Concern  . Not on file  Social History Narrative   Lives with wife and daughter   Caffeine- coffee 1 cup, 1 soda a day, green tea 1 glass   Social Determinants of Health   Financial Resource Strain: Not on file  Food Insecurity: Not on file  Transportation Needs: Not on file  Physical Activity: Not on file  Stress: Not on file  Social Connections: Not on file  Intimate Partner Violence: Not on file   Family History  Problem Relation Age of Onset  . Hypertension Mother   . Hypertension Father   . Rheum arthritis Sister     OBJECTIVE: Vitals:   08/25/20 1313  BP: (!) 164/76  Pulse: 66  Resp: 18  Temp: 98 F (36.7 C)  SpO2: 97%    General appearance: alert; no distress Head: NCAT Lungs: clear to auscultation bilaterally Heart: regular rate and rhythm.  Radial  pulse 2+ bilaterally Extremities: no edema Skin: warm and dry; scaly patch of erythematous rash to forehead, about 2 cm in diameter, non tender, no warmth or drainage noted from the area Psychological: alert and cooperative; normal mood and affect  ASSESSMENT & PLAN:  1. Rash and nonspecific skin eruption   2. Itching     Meds ordered this encounter  Medications  . dexamethasone (DECADRON) injection 10 mg   Decadron 10mg  IM in office today May use OTC cortisone cream to the area BID prn itching May take benadryl at home as well Avoid hot showers/ baths Moisturize skin daily  Follow up with PCP if symptoms persists Return or go to the ER if you have any  new or worsening symptoms such as fever, chills, nausea, vomiting, redness, swelling, discharge, if symptoms do not improve with medications  Reviewed expectations re: course of current medical issues. Questions answered. Outlined signs and symptoms indicating need for more acute intervention. Patient verbalized understanding. After Visit Summary given.   Faustino Congress, NP 08/27/20 2021

## 2020-10-29 ENCOUNTER — Ambulatory Visit: Payer: Medicare HMO | Admitting: Adult Health

## 2021-07-20 ENCOUNTER — Emergency Department (HOSPITAL_COMMUNITY)
Admission: EM | Admit: 2021-07-20 | Discharge: 2021-07-20 | Disposition: A | Discharge status: Home / Self Care | Payer: Medicare HMO | Attending: Emergency Medicine | Admitting: Emergency Medicine

## 2021-07-20 ENCOUNTER — Other Ambulatory Visit: Payer: Self-pay

## 2021-07-20 ENCOUNTER — Emergency Department (HOSPITAL_COMMUNITY): Payer: Medicare HMO

## 2021-07-20 ENCOUNTER — Encounter (HOSPITAL_COMMUNITY): Payer: Self-pay

## 2021-07-20 DIAGNOSIS — Z7982 Long term (current) use of aspirin: Secondary | ICD-10-CM | POA: Diagnosis not present

## 2021-07-20 DIAGNOSIS — I10 Essential (primary) hypertension: Secondary | ICD-10-CM | POA: Insufficient documentation

## 2021-07-20 DIAGNOSIS — M79672 Pain in left foot: Secondary | ICD-10-CM | POA: Insufficient documentation

## 2021-07-20 DIAGNOSIS — Z79899 Other long term (current) drug therapy: Secondary | ICD-10-CM | POA: Insufficient documentation

## 2021-07-20 DIAGNOSIS — Z87891 Personal history of nicotine dependence: Secondary | ICD-10-CM | POA: Diagnosis not present

## 2021-07-20 LAB — URIC ACID: Uric Acid, Serum: 9.1 mg/dL — ABNORMAL HIGH (ref 3.7–8.6)

## 2021-07-20 MED ORDER — NAPROXEN 500 MG PO TABS: 500.0000 mg | ORAL_TABLET | Freq: Two times a day (BID) | ORAL | 0 refills | Status: DC

## 2021-07-20 MED ORDER — NAPROXEN 250 MG PO TABS
500.0000 mg | ORAL_TABLET | Freq: Once | ORAL | Status: AC
  Administered 2021-07-20: 500 mg via ORAL
  Filled 2021-07-20: qty 2

## 2021-07-20 NOTE — ED Triage Notes (Addendum)
Pt reports pain and swelling to left ankle down to his foot that started 3 days ago associated with cramping to left leg and pain with ambulation. Denies injury. Strong pedal pulse, warm to touch. Hx of stroke, not on blood thinners.

## 2021-07-20 NOTE — ED Provider Notes (Signed)
[facility] EMERGENCY DEPARTMENT Provider Note   CSN: [id number] Arrival date & time: 07/20/21  1927     History Chief Complaint  Patient presents with   Foot Pain    Ralph Martinez is a 70 y.o. male with history of CVA not currently on any anticoagulation apart from 81 mg of aspirin and gout who presents to the emergency department with a 1 day history of left foot pain.  Patient denies any trauma or injury to the foot.  It started hurting out of the blue.  His foot pain has been constant since onset however it is improving after placing the foot in Epsom salt bath and using pot oil.  Currently rates his foot pain moderate severity.  Patient reports associated swelling at the ankles.  He states this feels different from when he had a stroke back in 2020.  No facial droop, trouble speaking, weakness to the lower leg, numbness to the lower leg.  Given that his stroke symptoms started in the left leg at the time he had a stroke he was concerned which prompted his arrival to the emergency room today.   Foot Pain      Past Medical History:  Diagnosis Date   High cholesterol    Hypertension    Kidney stones    Stroke ([place]) 12/01/2018    Patient Active Problem List   Diagnosis Date Noted   Essential hypertension 12/02/2018   Hyperlipidemia LDL goal <70 12/02/2018   Renal insufficiency 12/02/2018   CVA (cerebral vascular accident) ([place]) R thalamic s/p IV tPA 12/01/2018   Acute cholecystitis 07/13/2016    Past Surgical History:  Procedure Laterality Date   CHOLECYSTECTOMY N/A 07/14/2016   Procedure: LAPAROSCOPIC CHOLECYSTECTOMY;  Surgeon: Vickie Epley, MD;  Location: [facility] ORS;  Service: General;  Laterality: N/A;   KIDNEY STONE SURGERY         Family History  Problem Relation Age of Onset   Hypertension Mother    Hypertension Father    Rheum arthritis Sister     Social History   Tobacco Use   Smoking status: Former    Packs/day: 0.08    Types: Cigarettes    Quit  date: 07/22/1978    Years since quitting: 43.0   Smokeless tobacco: Former  Substance Use Topics   Alcohol use: Yes    Comment: occasional   Drug use: No    Home Medications Prior to Admission medications   Medication Sig Start Date End Date Taking? Authorizing Provider  naproxen (NAPROSYN) 500 MG tablet Take 1 tablet (500 mg total) by mouth 2 (two) times daily. 07/20/21  Yes Myna Bright M, PA-C  aspirin EC 81 MG EC tablet Take 1 tablet (81 mg total) by mouth daily. 12/02/18   Donzetta Starch, NP  atorvastatin (LIPITOR) 40 MG tablet Take 1 tablet (40 mg total) by mouth daily at 6 PM. 12/02/18   Donzetta Starch, NP  L-Methylfolate-B12-B6-B2 (CEREFOLIN) 01-16-49-5 MG TABS Take 1 tablet by mouth every morning. 07/30/20   Garvin Fila, MD  valsartan (DIOVAN) 320 MG tablet Take 320 mg by mouth daily. 10/19/18   [provider]    Allergies    Codeine and Erythromycin  Review of Systems   Review of Systems  Physical Exam Updated Vital Signs BP 114/65   Pulse 63   Temp 98.4 F (36.9 C) (Oral)   Resp 17   Ht 6' (1.829 m)   Wt 98 kg   SpO2 97%  BMI 29.29 kg/m   Physical Exam Vitals and nursing note reviewed.  Constitutional:      General: He is not in acute distress.    Appearance: Normal appearance.  HENT:     Head: Normocephalic and atraumatic.  Eyes:     General:        Right eye: No discharge.        Left eye: No discharge.  Cardiovascular:     Comments: Regular rate and rhythm.  S1/S2 are distinct without any evidence of murmur, rubs, or gallops.  Radial pulses are 2+ bilaterally.  Dorsalis pedis pulses are 2+ bilaterally.  No evidence of pedal edema. Pulmonary:     Comments: Clear to auscultation bilaterally.  Normal effort.  No respiratory distress.  No evidence of wheezes, rales, or rhonchi heard throughout. Abdominal:     General: Abdomen is flat. Bowel sounds are normal. There is no distension.     Tenderness: There is no abdominal tenderness. There is  no guarding or rebound.  Musculoskeletal:        General: Normal range of motion.     Cervical back: Neck supple.     Comments: There is mild swelling to the medial and lateral malleolus on the left.  Foot is nontender to palpation.  There is mild calf swelling.  No calf tenderness.  2+ dorsalis pedis pulse felt on the left.  He has 5/5 strength in the lower legs normal sensation in the lower legs.  Metatarsal joints and ankle joint are nonerythematous or warm to palpation.  Skin:    General: Skin is warm and dry.     Findings: No rash.  Neurological:     General: No focal deficit present.     Mental Status: He is alert.  Psychiatric:        Mood and Affect: Mood normal.        Behavior: Behavior normal.    ED Results / Procedures / Treatments   Labs (all labs ordered are listed, but only abnormal results are displayed) Labs Reviewed  URIC ACID - Abnormal; Notable for the following components:      Result Value   Uric Acid, Serum 9.1 (*)    All other components within normal limits    EKG None  Radiology DG Foot Complete Left  Result Date: 07/20/2021 CLINICAL DATA:  Left foot pain EXAM: LEFT FOOT - COMPLETE 3+ VIEW COMPARISON:  None. FINDINGS: Normal alignment. No acute fracture or dislocation. 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 Vascular calcifications are seen within the left ankle. Soft tissues are otherwise unremarkable. IMPRESSION: No acute fracture or dislocation Electronically Signed   By: Fidela Salisbury M.D.   On: 07/20/2021 22:01    Procedures Procedures   Medications Ordered in ED Medications  naproxen (NAPROSYN) tablet 500 mg (has no administration in time range)    ED Course  I have reviewed the triage vital signs and the nursing notes.  Pertinent labs & imaging results that were available during my care of the patient were reviewed by me and considered in my medical decision making (see chart for  details).  Clinical Course as of 07/20/21 2254  Sat Jul 20, 2021  2043 I discussed this case with my attending physician who cosigned this note including patient's presenting symptoms, physical exam, and planned diagnostics and interventions. Attending physician stated agreement with plan or made changes to plan which were implemented.      [  CF]    Clinical Course User Index [CF] Cherrie Gauze   MDM Rules/Calculators/A&P                          Ralph Martinez is a 70 y.o. male who presents the emergency department with left foot pain.  Given his history and physical exam, I will get a foot x-ray and uric acid level.  Unfortunately ultrasound is not here to evaluate for DVT in the lower extremity.  I will likely give him an order to come back tomorrow for an ultrasound.  Uric acid level was slightly elevated.  Imaging of the left foot was negative.  Given the clinical scenario, I will start the patient on a short course of naproxen to cover bases for possible gout.  I will also put an order for an ultrasound to be done tomorrow morning with the radiology department.  Strict turn precautions given.  He is safe for discharge.   Final Clinical Impression(s) / ED Diagnoses Final diagnoses:  Foot pain, left    Rx / DC Orders ED Discharge Orders          Ordered    US Venous Img Lower Unilateral Left        07/20/21 2212    naproxen (NAPROSYN) 500 MG tablet  2 times daily        07/20/21 2217             Myna Bright Ronald, [place] 07/20/21 2254    Milton Ferguson, MD 07/20/21 2304

## 2021-07-20 NOTE — Discharge Instructions (Signed)
Your x-ray was negative for any fractures or dislocations.  Your uric acid level was slightly elevated.  I will give you naproxen which is a anti-inflammatory medication to take over the next 5 days.  I would also like for you to come back to any pain hospital tomorrow for an ultrasound.  Please come to the emergency room waiting room and notify some of the you are here for an ultrasound.  Please follow-up with your primary care provider.

## 2021-07-21 ENCOUNTER — Ambulatory Visit (HOSPITAL_COMMUNITY)
Admission: RE | Admit: 2021-07-21 | Discharge: 2021-07-21 | Disposition: A | Discharge status: Home / Self Care | Payer: Medicare HMO | Source: Ambulatory Visit | Attending: Emergency Medicine | Admitting: Emergency Medicine

## 2021-07-21 DIAGNOSIS — M7989 Other specified soft tissue disorders: Secondary | ICD-10-CM | POA: Diagnosis not present

## 2021-07-21 DIAGNOSIS — M79605 Pain in left leg: Secondary | ICD-10-CM | POA: Diagnosis present

## 2021-07-21 NOTE — ED Provider Notes (Signed)
  Patient seen here last evening, returned this morning for scheduled outpatient venous imaging of left lower extremity for DVT rule out.  Ultrasound study negative for DVT.  Discussed findings with patient.  He will follow-up with PCP.   US Venous Img Lower Unilateral Left  Result Date: 07/21/2021 CLINICAL DATA:  Left leg pain and swelling. EXAM: LEFT LOWER EXTREMITY VENOUS DOPPLER ULTRASOUND TECHNIQUE: Gray-scale sonography with compression, as well as color and duplex ultrasound, were performed to evaluate the deep venous system(s) from the level of the common femoral vein through the popliteal and proximal calf veins. COMPARISON:  08/25/2016 FINDINGS: VENOUS Normal compressibility of the common femoral, superficial femoral, and popliteal veins, as well as the visualized calf veins. Visualized portions of profunda femoral vein and great saphenous vein unremarkable. No filling defects to suggest DVT on grayscale or color Doppler imaging. Doppler waveforms show normal direction of venous flow, normal respiratory plasticity and response to augmentation. Limited views of the contralateral common femoral vein are unremarkable. OTHER None. Limitations: none IMPRESSION: Negative. Electronically Signed   By: Misty Stanley M.D.   On: 07/21/2021 09:43      Kem Parkinson, PA-C 07/21/21 [place], [place], MD 07/25/21 [phone]

## 2021-07-30 ENCOUNTER — Other Ambulatory Visit: Payer: Self-pay | Admitting: Urology

## 2021-08-06 ENCOUNTER — Other Ambulatory Visit: Payer: Self-pay

## 2021-08-06 ENCOUNTER — Encounter (HOSPITAL_BASED_OUTPATIENT_CLINIC_OR_DEPARTMENT_OTHER): Payer: Self-pay | Admitting: Urology

## 2021-08-06 NOTE — Progress Notes (Signed)
Spoke w/ via phone for pre-op interview---pt Lab needs dos---- ISTAT, EKG per anesthesia              Lab results------08/02/2019 Echo LVEF 60-65% in Epic COVID test -----patient states asymptomatic no test needed Arrive at -------1000 on 08/09/21 NPO after MN NO Solid Food.  Clear liquids from MN until---0900 Med rec completed Medications to take morning of surgery -----none Pt was advised by Dr. Willey Blade, internal medicine to stop ASA 81 mg for 5 days before surgery. Patient is aware. Per pt, his last dose of ASA 81 mg was on 08/01/2021. Diabetic medication -----n/a Patient instructed no nail polish to be worn day of surgery Patient instructed to bring photo id and insurance card day of surgery Patient aware to have Driver (ride ) / caregiver    for 24 hours after surgery - wife, Ralph Martinez Patient Special Instructions -----none Pre-Op special Istructions -----none Patient verbalized understanding of instructions that were given at this phone interview. Patient denies shortness of breath, chest pain, fever, cough at this phone interview.   Medical clearance was given for surgery by Dr. Asencion Noble on 07/31/21. See note in chart. Patient has a h/o stroke on 12/01/2018. He was treated with IV tpa. Patient was diagnosed with only mild cognitive impairment in 07/2020. He states that the only difficulty he has is occasional difficulty with short-term memory. He was able to give his complete history on the phone without assistance. Patient should be able to sign own consent. [facility] : Dr. Antony Contras on 07/30/2020 in Epic.

## 2021-08-08 NOTE — H&P (Signed)
I have kidney stones.  HPI: Ralph Martinez is a 70 year-old male patient who was referred by Dr. Paula Compton. Claudia Pollock, MD who is here for renal calculi.    Ralph Martinez is a 70 yo male who is a former patient of Dr. Karsten Ro with a history of stones. He was recently in the ER with left foot pain and swelling. He had no evidence of a DVT but his uric acid level was 9.1. He was told he didn't have gout. He has had no hematuria or flank pain and is voiding without difficult with an IPSS of 2. His UA is unremarkable but he had a CT in 2017 that showed bilateral renal stones and several bladder stones and he has not had any imaging for that since. He has has some chronic intermittent back pain. He has had a prior cystolithalopaxy in 2010 and has had ESWL and Ureteroscopy in the past.      ALLERGIES: Codeine Derivatives Erythromycin    MEDICATIONS: Aspirin 81 mg tablet,chewable  Atorvastatin Calcium 40 mg tablet  Cerefolin 1 mg-6 mg-50 mg-5 mg tablet  Methylfolate  Naproxen 500 mg tablet  Valsartan 320 mg tablet     GU PSH: Cysto Bladder Stone >2.5cm - 2010 ESWL - 2010, 2010, 2010 Ureteroscopic stone removal - 2010       PSH Notes: Cystoscopy With Fragmentation Of Bladder Calculus Over 2.5cm, Cystoscopy With Ureteroscopy With Removal Of Calculus, Lithotripsy, Lithotripsy, Lithotripsy   NON-GU PSH: Cholecystectomy (laparoscopic) - 2017     GU PMH: Renal calculus, Bilateral kidney stones - 2015 ED due to arterial insufficiency, Erectile dysfunction due to arterial insufficiency - 2014 History of urolithiasis, Nephrolithiasis - 2014      PMH Notes:  2009-04-15 14:06:45 - Note: Bladder Calculus  2009-04-03 11:23:16 - Note: Skin Cancer  stroke   NON-GU PMH: Encounter for general adult medical examination without abnormal findings, Encounter for preventive health examination - 2015 Personal history of other diseases of the circulatory system, History of hypertension - 2014 Personal history of other  endocrine, nutritional and metabolic disease, History of hypercholesterolemia - 2014 Gout Hypercholesterolemia Hypertension    FAMILY HISTORY: Bladder Cancer - Father Chronic Obstructive Pulmonary Disease - Mother Colon Cancer - Uncle Death In The Family Father - Mother Death In The Family Mother - Other Dementia - Father Family Health Status - Father alive at age 15 - Mother Hypertension - Runs in Family rheumatoid arthritis - Sister stroke - Grandfather   SOCIAL HISTORY: Marital Status: Married Current Smoking Status: Patient does not smoke anymore. Has not smoked since 07/18/1977. Smoked for 4 years. Smoked 1/2 pack per day.   Tobacco Use Assessment Completed: Used Tobacco in last 30 days? Does not use smokeless tobacco. Does not use drugs.     Notes: Occupation:, Previous History Of Smoking, Caffeine Use, Alcohol Use, Marital History - Currently Married   REVIEW OF SYSTEMS:    GU Review Male:   Patient reports frequent urination. Patient denies hard to postpone urination, burning/ pain with urination, get up at night to urinate, leakage of urine, stream starts and stops, trouble starting your stream, have to strain to urinate , erection problems, and penile pain.  Gastrointestinal (Upper):   Patient reports indigestion/ heartburn. Patient denies nausea and vomiting.  Gastrointestinal (Lower):   Patient reports diarrhea. Patient denies constipation.  Constitutional:   Patient denies fever, night sweats, weight loss, and fatigue.  Skin:   Patient denies skin rash/ lesion and itching.  Eyes:  Patient denies blurred vision and double vision.  Ears/ Nose/ Throat:   Patient denies sore throat and sinus problems.  Hematologic/Lymphatic:   Patient denies swollen glands and easy bruising.  Cardiovascular:   Patient reports leg swelling. Patient denies chest pains.  Respiratory:   Patient denies cough and shortness of breath.  Endocrine:   Patient denies excessive thirst.   Musculoskeletal:   Patient reports back pain and joint pain.   Neurological:   Patient denies headaches and dizziness.  Psychologic:   Patient denies depression and anxiety.   VITAL SIGNS:      07/24/2021 09:21 AM  Weight 220 lb / 99.79 kg  Height 72 in / 182.88 cm  BP 134/64 mmHg  Heart Rate 78 /min  Temperature 97.8 F / 36.5 C  BMI 29.8 kg/m   MULTI-SYSTEM PHYSICAL EXAMINATION:    Constitutional: Well-nourished. No physical deformities. Normally developed. Good grooming.  Neck: Neck symmetrical, not swollen. Normal tracheal position.  Respiratory: Normal breath sounds. No labored breathing, no use of accessory muscles.   Cardiovascular: Regular rate and rhythm. No murmur, no gallop. mild swelling in left ankle.  Skin: No paleness, no jaundice, no cyanosis. No lesion, no ulcer, no rash.  Neurologic / Psychiatric: Oriented to time, oriented to place, oriented to person. No depression, no anxiety, no agitation.  Gastrointestinal: Obese abdomen. No mass, no tenderness, no rigidity.   Musculoskeletal: Normal gait and station of head and neck.     Complexity of Data:  Lab Test Review:   Uric Acid  Records Review:   AUA Symptom Score, Previous Hospital Records, Previous Patient Records  Urine Test Review:   Urinalysis  X-Ray Review: C.T. Abdomen/Pelvis: Reviewed Films. Discussed With Patient. 2017    PROCEDURES:         C.T. 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 There is mild right hydro and an 60mm stone or pair of stones in the right mid to distal ureter. He has several bladdder stones as well with the largest 1.5cm and the group about 2cm. A full report is pending.       Patient confirmed No Neulasta OnPro Device.         Urinalysis w/Scope Dipstick Dipstick Cont'd Micro  Color: Straw Bilirubin: Neg mg/dL WBC/hpf: 0 - 5/hpf  Appearance: Clear Ketones: Neg mg/dL RBC/hpf: 0 -  2/hpf  Specific Gravity: <=1.005 Blood: 2+ ery/uL Bacteria: Rare (0-9/hpf)  pH: 6.0 Protein: Neg mg/dL Cystals: NS (Not Seen)  Glucose: Neg mg/dL Urobilinogen: 0.2 mg/dL Casts: NS (Not Seen)    Nitrites: Neg Trichomonas: Not Present    Leukocyte Esterase: 1+ leu/uL Mucous: Not Present      Epithelial Cells: NS (Not Seen)      Yeast: NS (Not Seen)      Sperm: Not Present    ASSESSMENT:      ICD-10 Details  2 GU:   Renal calculus - N20.0 Chronic, Worsening - He has bilateral renal stones with increased stone burden particularly in the RUP and will need to be considered for a PCNL at some point in the future.   3   Bladder Stone - N21.0 Chronic, Worsening - He has bladder stones that have been present for many years and he is not too symptomatic but since he will need treatment of the right ureteral stone, I will remove the bladder stones as well.   4  Ureteral calculus - N20.1 Right, Undiagnosed New Problem - He has a large stone in the right mid/distal ureter with some obstruction. I believe he will be best served by URI as the bladder stones need treatment too. I have reviewed the risks of ureteroscopy including bleeding, infection, ureteral injury, need for a stent or secondary procedures, thrombotic events and anesthetic complications.   5   Ureteral obstruction secondary to calculous - N13.2 Undiagnosed New Problem - I will get a hypercalciuria profile today to assess renal function and the uric acid level.   1 NON-GU:   Hyperuricemia - M76.8 Acute, Uncomplicated - His uric acid was 9.1 in the ER on 12/3. I will repeat that today and he may need to have Dr. Willey Blade treat him for that. He has some left foot pain but was told it was not gout, but I'm not so sure.    PLAN:            Medications Stop Meds: Fluticasone Propionate 50 mcg/actuation spray, suspension Nasal  Start: 09/28/2012  Discontinue: 07/24/2021  - Reason: The medication cycle was completed.  Garlic 088 MG Oral Tablet Oral   Start: 10/15/2011  Discontinue: 07/24/2021  - Reason: The medication cycle was completed.  Ibuprofen 400 MG Oral Tablet 0 Oral  Start: 06/25/2011  Discontinue: 07/24/2021  - Reason: The medication cycle was completed.  Indapamide 2.5 MG Oral Tablet 1 Oral Daily  Start: 10/01/2011  Discontinue: 07/24/2021  - Reason: The medication cycle was completed.  Losartan Potassium 50 mg tablet Oral  Start: 05/13/2011  Discontinue: 07/24/2021  - Reason: The medication cycle was completed.  Pravastatin Sodium 20 MG Oral Tablet Oral  Start: 04/03/2009  Discontinue: 07/24/2021  - Reason: The medication cycle was completed.  TH Vitamin C 500 MG TABS Oral  Start: 10/15/2011  Discontinue: 07/24/2021  - Reason: The medication cycle was completed.  Urocit-K 15 meq (1,620 mg) tablet, extended release 0 Oral  Start: 05/21/2009  Discontinue: 07/24/2021  - Reason: The medication cycle was completed.  Vitamin E 100 UNIT Oral Tablet Oral  Start: 10/15/2011  Discontinue: 07/24/2021  - Reason: The medication cycle was completed.            Orders Labs Hypercalciura Profile  X-Rays: C.T. Stone Protocol Without I.V. Contrast  X-Ray Notes: History:  Hematuria: Yes/No  Patient to see MD after exam: Yes/No  Previous exam: CT / IVP/ US/ KUB/ None  When:  Where:  Diabetic: Yes/ No  BUN/ Creatinine:  Date of last BUN Creatinine:  Weight in pounds:  Allergy- IV Contrast: Yes/ No  Conflicting diabetic meds: Yes/ No  Diabetic Meds:  Prior Authorization #Yisroel Ramming # [id number] Valid 07/24/21 thru 01/20/22             Schedule Return Visit/Planned Activity: Next Available Appointment - Schedule Surgery  Procedure: Unspecified Date - Cysto Bladder Stone <2.5cm - 59292 Notes: Next available.   Procedure: Unspecified Date - Cysto Uretero Lithotripsy - [phone] Notes: Next available.           Document Letter(s):  Created for Patient: Clinical Summary

## 2021-08-09 ENCOUNTER — Ambulatory Visit (HOSPITAL_BASED_OUTPATIENT_CLINIC_OR_DEPARTMENT_OTHER): Payer: Medicare HMO | Admitting: Anesthesiology

## 2021-08-09 ENCOUNTER — Encounter (HOSPITAL_BASED_OUTPATIENT_CLINIC_OR_DEPARTMENT_OTHER)
Admission: RE | Disposition: A | Discharge status: Home / Self Care | Payer: Self-pay | Source: Home / Self Care | Attending: Urology

## 2021-08-09 ENCOUNTER — Encounter (HOSPITAL_BASED_OUTPATIENT_CLINIC_OR_DEPARTMENT_OTHER): Payer: Self-pay | Admitting: Urology

## 2021-08-09 ENCOUNTER — Other Ambulatory Visit: Payer: Self-pay

## 2021-08-09 ENCOUNTER — Ambulatory Visit (HOSPITAL_BASED_OUTPATIENT_CLINIC_OR_DEPARTMENT_OTHER)
Admission: RE | Admit: 2021-08-09 | Discharge: 2021-08-09 | Disposition: A | Discharge status: Home / Self Care | Payer: Medicare HMO | Attending: Urology | Admitting: Urology

## 2021-08-09 ENCOUNTER — Ambulatory Visit (HOSPITAL_BASED_OUTPATIENT_CLINIC_OR_DEPARTMENT_OTHER): Payer: Medicare HMO

## 2021-08-09 DIAGNOSIS — Z79899 Other long term (current) drug therapy: Secondary | ICD-10-CM | POA: Diagnosis not present

## 2021-08-09 DIAGNOSIS — N202 Calculus of kidney with calculus of ureter: Secondary | ICD-10-CM | POA: Insufficient documentation

## 2021-08-09 DIAGNOSIS — E790 Hyperuricemia without signs of inflammatory arthritis and tophaceous disease: Secondary | ICD-10-CM | POA: Insufficient documentation

## 2021-08-09 DIAGNOSIS — E785 Hyperlipidemia, unspecified: Secondary | ICD-10-CM | POA: Diagnosis not present

## 2021-08-09 DIAGNOSIS — I10 Essential (primary) hypertension: Secondary | ICD-10-CM | POA: Insufficient documentation

## 2021-08-09 DIAGNOSIS — Z87442 Personal history of urinary calculi: Secondary | ICD-10-CM | POA: Diagnosis not present

## 2021-08-09 DIAGNOSIS — M79672 Pain in left foot: Secondary | ICD-10-CM | POA: Diagnosis not present

## 2021-08-09 DIAGNOSIS — N210 Calculus in bladder: Secondary | ICD-10-CM | POA: Insufficient documentation

## 2021-08-09 DIAGNOSIS — Z87891 Personal history of nicotine dependence: Secondary | ICD-10-CM | POA: Insufficient documentation

## 2021-08-09 DIAGNOSIS — G3184 Mild cognitive impairment, so stated: Secondary | ICD-10-CM | POA: Diagnosis not present

## 2021-08-09 HISTORY — PX: CYSTOSCOPY WITH LITHOLAPAXY: SHX1425

## 2021-08-09 HISTORY — DX: Gout, unspecified: M10.9

## 2021-08-09 LAB — POCT I-STAT, CHEM 8
BUN: 14 mg/dL (ref 8–23)
Calcium, Ion: 1.26 mmol/L (ref 1.15–1.40)
Chloride: 106 mmol/L (ref 98–111)
Creatinine, Ser: 1.1 mg/dL (ref 0.61–1.24)
Glucose, Bld: 113 mg/dL — ABNORMAL HIGH (ref 70–99)
HCT: 40 % (ref 39.0–52.0)
Hemoglobin: 13.6 g/dL (ref 13.0–17.0)
Potassium: 3.7 mmol/L (ref 3.5–5.1)
Sodium: 144 mmol/L (ref 135–145)
TCO2: 23 mmol/L (ref 22–32)

## 2021-08-09 SURGERY — CYSTOSCOPY, WITH BLADDER CALCULUS LITHOLAPAXY
Anesthesia: General | Site: Ureter | Laterality: Right

## 2021-08-09 MED ORDER — SODIUM CHLORIDE 0.9 % IR SOLN
Status: DC | PRN
  Administered 2021-08-09 (×2): 3000 mL

## 2021-08-09 MED ORDER — OXYCODONE HCL 5 MG PO TABS: 5.0000 mg | ORAL_TABLET | ORAL | Status: DC | PRN

## 2021-08-09 MED ORDER — LIDOCAINE HCL (CARDIAC) PF 100 MG/5ML IV SOSY
PREFILLED_SYRINGE | INTRAVENOUS | Status: DC | PRN
  Administered 2021-08-09: 60 mg via INTRAVENOUS

## 2021-08-09 MED ORDER — HYDROCODONE-ACETAMINOPHEN 5-325 MG PO TABS: 2.0000 | ORAL_TABLET | Freq: Four times a day (QID) | ORAL | 0 refills | Status: DC | PRN

## 2021-08-09 MED ORDER — ACETAMINOPHEN 500 MG PO TABS
1000.0000 mg | ORAL_TABLET | Freq: Once | ORAL | Status: AC
  Administered 2021-08-09: 10:00:00 1000 mg via ORAL

## 2021-08-09 MED ORDER — PROPOFOL 10 MG/ML IV BOLUS
INTRAVENOUS | Status: DC | PRN
  Administered 2021-08-09: 200 mg via INTRAVENOUS

## 2021-08-09 MED ORDER — GLYCOPYRROLATE PF 0.2 MG/ML IJ SOSY
PREFILLED_SYRINGE | INTRAMUSCULAR | Status: AC
  Filled 2021-08-09: qty 1

## 2021-08-09 MED ORDER — PROPOFOL 10 MG/ML IV BOLUS
INTRAVENOUS | Status: AC
  Filled 2021-08-09: qty 20

## 2021-08-09 MED ORDER — ACETAMINOPHEN 500 MG PO TABS
ORAL_TABLET | ORAL | Status: AC
  Filled 2021-08-09: qty 2

## 2021-08-09 MED ORDER — PHENAZOPYRIDINE HCL 200 MG PO TABS: 200.0000 mg | ORAL_TABLET | Freq: Three times a day (TID) | ORAL | 1 refills | Status: DC | PRN

## 2021-08-09 MED ORDER — ACETAMINOPHEN 325 MG RE SUPP: 650.0000 mg | RECTAL | Status: DC | PRN

## 2021-08-09 MED ORDER — DEXAMETHASONE SODIUM PHOSPHATE 4 MG/ML IJ SOLN
INTRAMUSCULAR | Status: DC | PRN
  Administered 2021-08-09: 8 mg via INTRAVENOUS

## 2021-08-09 MED ORDER — PHENYLEPHRINE HCL (PRESSORS) 10 MG/ML IV SOLN
INTRAVENOUS | Status: DC | PRN
  Administered 2021-08-09 (×4): 80 ug via INTRAVENOUS

## 2021-08-09 MED ORDER — CEFAZOLIN SODIUM 1 G IJ SOLR
INTRAMUSCULAR | Status: AC
  Filled 2021-08-09: qty 20

## 2021-08-09 MED ORDER — FENTANYL CITRATE (PF) 100 MCG/2ML IJ SOLN
INTRAMUSCULAR | Status: DC | PRN
  Administered 2021-08-09: 50 ug via INTRAVENOUS
  Administered 2021-08-09 (×2): 25 ug via INTRAVENOUS

## 2021-08-09 MED ORDER — ONDANSETRON HCL 4 MG/2ML IJ SOLN
INTRAMUSCULAR | Status: DC | PRN
  Administered 2021-08-09: 4 mg via INTRAVENOUS

## 2021-08-09 MED ORDER — CEFAZOLIN SODIUM-DEXTROSE 2-4 GM/100ML-% IV SOLN
2.0000 g | INTRAVENOUS | Status: AC
  Administered 2021-08-09: 12:00:00 2 g via INTRAVENOUS

## 2021-08-09 MED ORDER — IOHEXOL 300 MG/ML  SOLN
INTRAMUSCULAR | Status: DC | PRN
  Administered 2021-08-09: 12:00:00 4 mL

## 2021-08-09 MED ORDER — CEFAZOLIN SODIUM-DEXTROSE 2-4 GM/100ML-% IV SOLN
INTRAVENOUS | Status: AC
  Filled 2021-08-09: qty 100

## 2021-08-09 MED ORDER — ONDANSETRON HCL 4 MG/2ML IJ SOLN: 4.0000 mg | Freq: Once | INTRAMUSCULAR | Status: DC | PRN

## 2021-08-09 MED ORDER — MORPHINE SULFATE (PF) 4 MG/ML IV SOLN: 2.0000 mg | INTRAVENOUS | Status: DC | PRN

## 2021-08-09 MED ORDER — FENTANYL CITRATE (PF) 100 MCG/2ML IJ SOLN
INTRAMUSCULAR | Status: AC
  Filled 2021-08-09: qty 2

## 2021-08-09 MED ORDER — FENTANYL CITRATE (PF) 100 MCG/2ML IJ SOLN: 25.0000 ug | INTRAMUSCULAR | Status: DC | PRN

## 2021-08-09 MED ORDER — AMISULPRIDE (ANTIEMETIC) 5 MG/2ML IV SOLN: 10.0000 mg | Freq: Once | INTRAVENOUS | Status: DC | PRN

## 2021-08-09 MED ORDER — SODIUM CHLORIDE 0.9% FLUSH: 3.0000 mL | INTRAVENOUS | Status: DC | PRN

## 2021-08-09 MED ORDER — GLYCOPYRROLATE 0.2 MG/ML IJ SOLN
INTRAMUSCULAR | Status: DC | PRN
  Administered 2021-08-09: .2 mg via INTRAVENOUS

## 2021-08-09 MED ORDER — SODIUM CHLORIDE 0.9% FLUSH: 3.0000 mL | Freq: Two times a day (BID) | INTRAVENOUS | Status: DC

## 2021-08-09 MED ORDER — LACTATED RINGERS IV SOLN: INTRAVENOUS | Status: DC

## 2021-08-09 MED ORDER — SODIUM CHLORIDE 0.9 % IV SOLN: 250.0000 mL | INTRAVENOUS | Status: DC | PRN

## 2021-08-09 MED ORDER — ACETAMINOPHEN 325 MG PO TABS: 650.0000 mg | ORAL_TABLET | ORAL | Status: DC | PRN

## 2021-08-09 MED ORDER — KETOROLAC TROMETHAMINE 15 MG/ML IJ SOLN: 15.0000 mg | Freq: Once | INTRAMUSCULAR | Status: DC | PRN

## 2021-08-09 MED ORDER — 0.9 % SODIUM CHLORIDE (POUR BTL) OPTIME
TOPICAL | Status: DC | PRN
  Administered 2021-08-09: 12:00:00 500 mL

## 2021-08-09 MED ORDER — EPHEDRINE SULFATE 50 MG/ML IJ SOLN
INTRAMUSCULAR | Status: DC | PRN
  Administered 2021-08-09 (×3): 5 mg via INTRAVENOUS
  Administered 2021-08-09: 10 mg via INTRAVENOUS

## 2021-08-09 SURGICAL SUPPLY — 25 items
BAG DRAIN URO-CYSTO SKYTR STRL (DRAIN) ×3 IMPLANT
BAG DRN UROCATH (DRAIN) ×1
BASKET STONE 1.7 NGAGE (UROLOGICAL SUPPLIES) ×2 IMPLANT
CLOTH BEACON ORANGE TIMEOUT ST (SAFETY) ×3 IMPLANT
COVER DOME SNAP 22 D (MISCELLANEOUS) ×2 IMPLANT
DRSG TEGADERM 2-3/8X2-3/4 SM (GAUZE/BANDAGES/DRESSINGS) ×2 IMPLANT
FIBER LASER FLEXIVA 1000 (UROLOGICAL SUPPLIES) ×2 IMPLANT
FIBER LASER FLEXIVA 365 (UROLOGICAL SUPPLIES) IMPLANT
FIBER LASER FLEXIVA 550 (UROLOGICAL SUPPLIES) IMPLANT
GLOVE SURG NEOPR MICRO LF SZ7 (GLOVE) ×4 IMPLANT
GLOVE SURG POLYISO LF SZ8 (GLOVE) ×3 IMPLANT
GOWN STRL REUS W/ TWL LRG LVL3 (GOWN DISPOSABLE) IMPLANT
GOWN STRL REUS W/TWL LRG LVL3 (GOWN DISPOSABLE) ×3
GOWN STRL REUS W/TWL XL LVL3 (GOWN DISPOSABLE) ×3 IMPLANT
KIT TURNOVER CYSTO (KITS) ×3 IMPLANT
MANIFOLD NEPTUNE II (INSTRUMENTS) ×3 IMPLANT
PACK CYSTO (CUSTOM PROCEDURE TRAY) ×3 IMPLANT
SHEATH URETERAL 12FRX28CM (UROLOGICAL SUPPLIES) ×2 IMPLANT
STENT URET 6FRX26 CONTOUR (STENTS) ×2 IMPLANT
TRACTIP FLEXIVA PULS ID 200XHI (Laser) IMPLANT
TRACTIP FLEXIVA PULSE ID 200 (Laser) ×3
TUBE CONNECTING 12'X1/4 (SUCTIONS)
TUBE CONNECTING 12X1/4 (SUCTIONS) IMPLANT
WATER STERILE IRR 3000ML UROMA (IV SOLUTION) ×5 IMPLANT
WATER STERILE IRR 500ML POUR (IV SOLUTION) IMPLANT

## 2021-08-09 NOTE — Anesthesia Postprocedure Evaluation (Signed)
Anesthesia Post Note  Patient: Ralph Martinez  Procedure(s) Performed: CYSTOSCOPY WITH LITHOLAPAXY RIGHT RETROGRADE RIGHT URETEROSCOPY WIHT LASER AND STENT (Right: Ureter)     Patient location during evaluation: PACU Anesthesia Type: General Level of consciousness: awake Pain management: pain level controlled Vital Signs Assessment: post-procedure vital signs reviewed and stable Respiratory status: spontaneous breathing, nonlabored ventilation, respiratory function stable and patient connected to nasal cannula oxygen Cardiovascular status: blood pressure returned to baseline and stable Postop Assessment: no apparent nausea or vomiting Anesthetic complications: no   No notable events documented.  Last Vitals:  Vitals:   08/09/21 1330 08/09/21 1406  BP: 132/77 (!) 143/70  Pulse: 76 75  Resp: 16 18  Temp:    SpO2: 97% 99%    Last Pain:  Vitals:   08/09/21 1406  TempSrc:   PainSc: 0-No pain                 Devika Dragovich P Harmoni Lucus

## 2021-08-09 NOTE — Anesthesia Procedure Notes (Signed)
Procedure Name: LMA Insertion Date/Time: 08/09/2021 11:34 AM Performed by: Georgeanne Nim, CRNA Pre-anesthesia Checklist: Patient identified, Emergency Drugs available, Suction available, Patient being monitored and Timeout performed Patient Re-evaluated:Patient Re-evaluated prior to induction Oxygen Delivery Method: Circle system utilized Preoxygenation: Pre-oxygenation with 100% oxygen Induction Type: IV induction Ventilation: Mask ventilation without difficulty LMA: LMA inserted LMA Size: 5.0 Number of attempts: 1 Placement Confirmation: CO2 detector, positive ETCO2 and breath sounds checked- equal and bilateral Tube secured with: Tape

## 2021-08-09 NOTE — Discharge Instructions (Addendum)
You may remove the stent by pulling the attached string on Monday morning.  If you don't feel you can do that, please contact the office on Tuesday morning to have it removed.   Please bring your stone fragments to the office at follow up so they can be analyzed.    Post Anesthesia Home Care Instructions  Activity: Get plenty of rest for the remainder of the day. A responsible individual must stay with you for 24 hours following the procedure.  For the next 24 hours, DO NOT: -Drive a car -Paediatric nurse -Drink alcoholic beverages -Take any medication unless instructed by your physician -Make any legal decisions or sign important papers.  Meals: Start with liquid foods such as gelatin or soup. Progress to regular foods as tolerated. Avoid greasy, spicy, heavy foods. If nausea and/or vomiting occur, drink only clear liquids until the nausea and/or vomiting subsides. Call your physician if vomiting continues.  Special Instructions/Symptoms: Your throat may feel dry or sore from the anesthesia or the breathing tube placed in your throat during surgery. If this causes discomfort, gargle with warm salt water. The discomfort should disappear within 24 hours.   [facility] [phone] Post Ureteroscopy With  Stent Instructions  Definitions:  Ureter: The duct that transports urine from the kidney to the bladder. Stent:   A plastic hollow tube that is placed into the ureter, from the kidney to the bladder to prevent the ureter from swelling shut.  GENERAL INSTRUCTIONS:  Despite the fact that no skin incisions were used, the area around the ureter and bladder is raw and irritated. The stent is a foreign body which will further irritate the bladder wall. This irritation is manifested by increased frequency of urination, both day and night, and by an increase in the urge to urinate. In some, the urge to urinate is present almost always. Sometimes the urge is strong enough  that you may not be able to stop yourself from urinating. The only real cure is to remove the stent and then give time for the bladder wall to heal which can't be done until the danger of the ureter swelling shut has passed, which varies.  You may see some blood in your urine while the stent is in place and a few days afterwards. Do not be alarmed, even if the urine was clear for a while. Get off your feet and drink lots of fluids until clearing occurs. If you start to pass clots or don't improve, call us.  DIET: You may return to your normal diet immediately. Because of the raw surface of your bladder, alcohol, spicy foods, acid type foods and drinks with caffeine may cause irritation or frequency and should be used in moderation. To keep your urine flowing freely and to avoid constipation, drink plenty of fluids during the day ( 8-10 glasses ). Tip: Avoid cranberry juice because it is very acidic.  ACTIVITY: Your physical activity doesn't need to be restricted. However, if you are very active, you may see some blood in your urine. We suggest that you reduce your activity under these circumstances until the bleeding has stopped.  BOWELS: It is important to keep your bowels regular during the postoperative period. Straining with bowel movements can cause bleeding. A bowel movement every other day is reasonable. Use a mild laxative if needed, such as Milk of Magnesia 2-3 tablespoons, or 2 Dulcolax tablets. Call if you continue to have problems. If you have been taking narcotics for pain, before, during  or after your surgery, you may be constipated. Take a laxative if necessary.   MEDICATION: You should resume your pre-surgery medications unless told not to. In addition you will often be given an antibiotic to prevent infection. These should be taken as prescribed until the bottles are finished unless you are having an unusual reaction to one of the drugs.  PROBLEMS YOU SHOULD REPORT TO [place]: Fevers  over 100.5 Fahrenheit. Heavy bleeding, or clots ( See above notes about blood in urine ). Inability to urinate. Drug reactions ( hives, rash, nausea, vomiting, diarrhea ). Severe burning or pain with urination that is not improving.  FOLLOW-UP: You will need a follow-up appointment to monitor your progress. Call for this appointment at the number listed above. Usually the first appointment will be about three to fourteen days after your surgery.

## 2021-08-09 NOTE — Transfer of Care (Signed)
Immediate Anesthesia Transfer of Care Note  Patient: Ralph Martinez  Procedure(s) Performed: CYSTOSCOPY WITH LITHOLAPAXY RIGHT RETROGRADE RIGHT URETEROSCOPY WIHT LASER AND STENT (Right: Ureter)  Patient Location: PACU  Anesthesia Type:General  Level of Consciousness: drowsy and patient cooperative  Airway & Oxygen Therapy: Patient Spontanous Breathing and Patient connected to nasal cannula oxygen  Post-op Assessment: Report given to RN and Post -op Vital signs reviewed and stable  Post vital signs: Reviewed and stable  Last Vitals:  Vitals Value Taken Time  BP    Temp    Pulse 86 08/09/21 1241  Resp    SpO2 97 % 08/09/21 1241  Vitals shown include unvalidated device data.  Last Pain:  Vitals:   08/09/21 1018  TempSrc: Oral  PainSc: 0-No pain      Patients Stated Pain Goal: 4 (87/68/11 5726)  Complications: No notable events documented.

## 2021-08-09 NOTE — Op Note (Signed)
Procedure: 1.  Cystoscopy with cystolitholapaxy of less than 2.5 cm bladder stones. 2.  Cystoscopy with right retrograde pyelogram and interpretation. 3.  Right ureteroscopy with holmium laser application, stone extraction and insertion of right double-J stent with tether. 4.  Application of fluoroscopy.  Preop diagnosis: 1.  Multiple small bladder stones with the largest 1.1 cm. 2.  11 mm right mid to distal ureteral stone.  Postop diagnosis: Same.  Surgeon: Dr. Irine Seal.  Anesthesia: General.  Specimen: Bladder and ureteral stone fragments.  Drain: 6 [place] by 26 cm contour double-J stent with tether.  EBL: None.  Complications: None.  Indications: [name] is a 70 year old male with history of urolithiasis who was recently found to have multiple bladder stones with the largest 1.1 cm and a total diameter of the cluster of 1.8 cm.  He was also noted to have an 11 mm right ureteral stone in the lower mid ureter with some obstruction it was felt that management of the bladder stones and ureteral stone were indicated.  He also has multiple right renal stones with a 2.3 cm partial staghorn stone in the upper pole but it was felt like those would have to be managed at a later date with percutaneous nephrolithotomy if needed.  Procedure: He was taken operating room where he was given antibiotic.  A general anesthetic was induced.  He was placed in lithotomy position and fitted with PAS hose.  His perineum and genitalia were prepped with Betadine solution he was draped in usual sterile fashion.  Cystoscopy was performed using a 21 [place] scope and 30 degree lens.  Examination: Normal urethra.  The external sphincter was intact.  The prostatic urethra was relatively short but there was bilobar hyperplasia with a prominent middle lobe.  There were multiple ovoid bladder stones noted as seen on CT.  He had moderate trabeculation but no mucosal lesions.  Ureteral orifices were  unremarkable.  Urethra was then calibrated to 30 [place] with [employer] sounds and a 26 French continuous-flow resectoscope sheath was inserted with the aid of visual obturator.  The sheath was then fitted with the greenlight laser bridge and 1000 m laser fiber was passed.  There were 3 stones that were removed intact.  The laser was set on 0.3 j and 53 Hz on the left pedal and 1 J and 20 Hz on the right pedal.  The right pedal power was increased to 1.8 J to improve fragmentation.  The 2 remaining stones were then fragmented sufficient to aspirate through the scope.  Once all of the stone fragments had been removed the resectoscope sheath was removed.  The cystoscope was then reinserted and a right retrograde pyelogram was performed with a 5 [place] open-ended catheter and Omnipaque.  The retrograde pyelogram demonstrated a normal caliber distal ureter to the filling defect which was the stone.  There is mild proximal dilation.  The multiple renal stones were visible over the kidney shadow.  A sensor wire was then advanced to the kidney and the cystoscope was removed.  A 28 cm digital access sheath 12 French inner core was then passed over the wire and easily advanced to the level of the stone.  The 6.5 French dual-lumen semirigid ureteroscope was then advanced alongside the wire but due to angulation I was not able to get to the level of the stone with good visualization.  The assembled access sheath was then passed over the wire without difficulty and the inner core and wire were  removed.  The dual-lumen digital flexible scope was then advanced through the sheath and the stone was visualized in the mid ureter just above the iliac pulsation and a dilated segment of ureter.  The stone was then engaged with a 200 m tract of laser fiber with the laser set on 0.3 J and 53 Hz on the left pedal and 1 J and 20 Hz on the right pedal.  The stone was broken into manageable fragments that were removed with an  engage basket.  Once all of the fragments were removed from the location of fragmentation, the scope was advanced to the renal pelvis to make sure no stones had refluxed and none had.  I did not attempt to treat the stones in the kidney this as they will be best managed with percutaneous removal in the future if needed.  A sensor wire was then replaced through the scope and the scope and sheath were removed.  The cystoscope was reinserted over the wire and a 6 [place] by 26 cm contour double-J stent with tether was passed to the kidney under fluoroscopic guidance.  The wire was removed, leaving a good coil in the kidney and a good coil in the bladder.  The bladder was drained and the cystoscope was removed leaving the stent string exiting the urethra.  The string was secured to the patient's penis.  Final fluoroscopy revealed good stent position.  He was taken down for lithotomy position, his anesthetic was reversed and he was moved recovery in stable condition.  There were no complications.

## 2021-08-09 NOTE — Anesthesia Preprocedure Evaluation (Addendum)
Anesthesia Evaluation  Patient identified by MRN, date of birth, ID band Patient awake    Reviewed: Allergy & Precautions, NPO status , Patient's Chart, lab work & pertinent test results  Airway Mallampati: III  TM Distance: >3 FB Neck ROM: Full    Dental  (+) Missing   Pulmonary former smoker,    Pulmonary exam normal breath sounds clear to auscultation       Cardiovascular hypertension, Pt. on medications Normal cardiovascular exam Rhythm:Regular Rate:Normal     Neuro/Psych Mild cognitive impairment CVA, No Residual Symptoms negative psych ROS   GI/Hepatic negative GI ROS, Neg liver ROS,   Endo/Other  negative endocrine ROS  Renal/GU Renal disease     Musculoskeletal negative musculoskeletal ROS (+)   Abdominal   Peds  Hematology HLD   Anesthesia Other Findings RIGHT URETERAL AND BLADDER STONE  Reproductive/Obstetrics                            Anesthesia Physical Anesthesia Plan  ASA: 2  Anesthesia Plan: General   Post-op Pain Management:    Induction: Intravenous  PONV Risk Score and Plan: 2 and Ondansetron, Dexamethasone and Treatment may vary due to age or medical condition  Airway Management Planned: LMA  Additional Equipment:   Intra-op Plan:   Post-operative Plan: Extubation in OR  Informed Consent: I have reviewed the patients History and Physical, chart, labs and discussed the procedure including the risks, benefits and alternatives for the proposed anesthesia with the patient or authorized representative who has indicated his/her understanding and acceptance.     Dental advisory given  Plan Discussed with: CRNA  Anesthesia Plan Comments:        Anesthesia Quick Evaluation

## 2021-08-09 NOTE — Interval H&P Note (Signed)
History and Physical Interval Note:  08/09/2021 11:10 AM  Ralph Martinez  has presented today for surgery, with the diagnosis of RIGHT URETERAL AND BLADDER STONE.  The various methods of treatment have been discussed with the patient and family. After consideration of risks, benefits and other options for treatment, the patient has consented to  Procedure(s) with comments: CYSTOSCOPY WITH LITHOLAPAXY RIGHT RETROGRADE RIGHT URETEROSCOPY WIHT LASER AND STENT (Right) - REQUESTING 74 MINS FOR CASE as a surgical intervention.  The patient's history has been reviewed, patient examined, no change in status, stable for surgery.  I have reviewed the patient's chart and labs.  Questions were answered to the patient's satisfaction.     Irine Seal

## 2021-08-13 ENCOUNTER — Encounter (HOSPITAL_BASED_OUTPATIENT_CLINIC_OR_DEPARTMENT_OTHER): Payer: Self-pay | Admitting: Urology

## 2021-08-14 ENCOUNTER — Encounter (HOSPITAL_COMMUNITY): Payer: Self-pay | Admitting: *Deleted

## 2021-08-14 ENCOUNTER — Other Ambulatory Visit: Payer: Self-pay

## 2021-08-14 ENCOUNTER — Emergency Department (HOSPITAL_COMMUNITY)
Admission: EM | Admit: 2021-08-14 | Discharge: 2021-08-14 | Disposition: A | Discharge status: Home / Self Care | Payer: Medicare HMO | Attending: Emergency Medicine | Admitting: Emergency Medicine

## 2021-08-14 ENCOUNTER — Emergency Department (HOSPITAL_COMMUNITY): Payer: Medicare HMO

## 2021-08-14 DIAGNOSIS — Z79899 Other long term (current) drug therapy: Secondary | ICD-10-CM | POA: Insufficient documentation

## 2021-08-14 DIAGNOSIS — Z85828 Personal history of other malignant neoplasm of skin: Secondary | ICD-10-CM | POA: Insufficient documentation

## 2021-08-14 DIAGNOSIS — Z7982 Long term (current) use of aspirin: Secondary | ICD-10-CM | POA: Insufficient documentation

## 2021-08-14 DIAGNOSIS — N201 Calculus of ureter: Secondary | ICD-10-CM | POA: Insufficient documentation

## 2021-08-14 DIAGNOSIS — Z87891 Personal history of nicotine dependence: Secondary | ICD-10-CM | POA: Diagnosis not present

## 2021-08-14 DIAGNOSIS — I10 Essential (primary) hypertension: Secondary | ICD-10-CM | POA: Insufficient documentation

## 2021-08-14 DIAGNOSIS — R338 Other retention of urine: Secondary | ICD-10-CM

## 2021-08-14 DIAGNOSIS — Z8616 Personal history of COVID-19: Secondary | ICD-10-CM | POA: Insufficient documentation

## 2021-08-14 DIAGNOSIS — R339 Retention of urine, unspecified: Secondary | ICD-10-CM | POA: Diagnosis present

## 2021-08-14 LAB — CBC WITH DIFFERENTIAL/PLATELET
Abs Immature Granulocytes: 0.06 K/uL (ref 0.00–0.07)
Basophils Absolute: 0.1 K/uL (ref 0.0–0.1)
Basophils Relative: 0 %
Eosinophils Absolute: 0 K/uL (ref 0.0–0.5)
Eosinophils Relative: 0 %
HCT: 39 % (ref 39.0–52.0)
Hemoglobin: 13.2 g/dL (ref 13.0–17.0)
Immature Granulocytes: 0 %
Lymphocytes Relative: 4 %
Lymphs Abs: 0.6 K/uL — ABNORMAL LOW (ref 0.7–4.0)
MCH: 30.5 pg (ref 26.0–34.0)
MCHC: 33.8 g/dL (ref 30.0–36.0)
MCV: 90.1 fL (ref 80.0–100.0)
Monocytes Absolute: 0.6 K/uL (ref 0.1–1.0)
Monocytes Relative: 4 %
Neutro Abs: 13.6 K/uL — ABNORMAL HIGH (ref 1.7–7.7)
Neutrophils Relative %: 92 %
Platelets: 186 K/uL (ref 150–400)
RBC: 4.33 MIL/uL (ref 4.22–5.81)
RDW: 12.8 % (ref 11.5–15.5)
WBC: 14.9 K/uL — ABNORMAL HIGH (ref 4.0–10.5)
nRBC: 0 % (ref 0.0–0.2)

## 2021-08-14 LAB — COMPREHENSIVE METABOLIC PANEL
ALT: 36 U/L (ref 0–44)
AST: 21 U/L (ref 15–41)
Albumin: 4.2 g/dL (ref 3.5–5.0)
Alkaline Phosphatase: 78 U/L (ref 38–126)
Anion gap: 8 (ref 5–15)
BUN: 16 mg/dL (ref 8–23)
CO2: 25 mmol/L (ref 22–32)
Calcium: 9 mg/dL (ref 8.9–10.3)
Chloride: 103 mmol/L (ref 98–111)
Creatinine, Ser: 1.19 mg/dL (ref 0.61–1.24)
GFR, Estimated: >60 mL/min (ref >60)
Glucose, Bld: 172 mg/dL — ABNORMAL HIGH (ref 70–99)
Potassium: 3.8 mmol/L (ref 3.5–5.1)
Sodium: 136 mmol/L (ref 135–145)
Total Bilirubin: 0.9 mg/dL (ref 0.3–1.2)
Total Protein: 6.6 g/dL (ref 6.5–8.1)

## 2021-08-14 LAB — URINALYSIS, ROUTINE W REFLEX MICROSCOPIC
Bacteria, UA: NONE SEEN
Bilirubin Urine: NEGATIVE
Glucose, UA: NEGATIVE mg/dL
Ketones, ur: NEGATIVE mg/dL
Leukocytes,Ua: NEGATIVE
Nitrite: POSITIVE — AB
Protein, ur: NEGATIVE mg/dL
Specific Gravity, Urine: 1.01 (ref 1.005–1.030)
pH: 6 (ref 5.0–8.0)

## 2021-08-14 MED ORDER — CEPHALEXIN 500 MG PO CAPS: 500.0000 mg | ORAL_CAPSULE | Freq: Four times a day (QID) | ORAL | 0 refills | Status: DC

## 2021-08-14 MED ORDER — OXYCODONE-ACETAMINOPHEN 5-325 MG PO TABS: 1.0000 | ORAL_TABLET | Freq: Four times a day (QID) | ORAL | 0 refills | Status: DC | PRN

## 2021-08-14 MED ORDER — HYDROMORPHONE HCL 1 MG/ML IJ SOLN
1.0000 mg | Freq: Once | INTRAMUSCULAR | Status: AC
  Administered 2021-08-14: 18:00:00 1 mg via INTRAVENOUS
  Filled 2021-08-14: qty 1

## 2021-08-14 MED ORDER — KETOROLAC TROMETHAMINE 30 MG/ML IJ SOLN
30.0000 mg | Freq: Once | INTRAMUSCULAR | Status: AC
  Administered 2021-08-14: 22:00:00 30 mg via INTRAVENOUS
  Filled 2021-08-14: qty 1

## 2021-08-14 MED ORDER — SODIUM CHLORIDE 0.9 % IV SOLN
1.0000 g | Freq: Once | INTRAVENOUS | Status: AC
  Administered 2021-08-14: 22:00:00 1 g via INTRAVENOUS
  Filled 2021-08-14: qty 10

## 2021-08-14 MED ORDER — ONDANSETRON HCL 4 MG/2ML IJ SOLN
4.0000 mg | Freq: Once | INTRAMUSCULAR | Status: AC
  Administered 2021-08-14: 22:00:00 4 mg via INTRAVENOUS
  Filled 2021-08-14: qty 2

## 2021-08-14 MED ORDER — ONDANSETRON HCL 4 MG/2ML IJ SOLN
4.0000 mg | Freq: Once | INTRAMUSCULAR | Status: AC
  Administered 2021-08-14: 18:00:00 4 mg via INTRAVENOUS
  Filled 2021-08-14: qty 2

## 2021-08-14 NOTE — ED Notes (Signed)
Patient transported to CT 

## 2021-08-14 NOTE — ED Triage Notes (Signed)
Pt here for urinary retention.  Pt states last voided at 1300 today. Recent bladder surgery and stent removed yesterday.

## 2021-08-14 NOTE — Discharge Instructions (Addendum)
Keep the Foley catheter in place until you are seen by urology tomorrow.  You may take the pain medication as directed if needed.  Return to the emergency department for any new or worsening symptoms.

## 2021-08-14 NOTE — ED Provider Notes (Signed)
[facility] EMERGENCY DEPARTMENT Provider Note   CSN: [id number] Arrival date & time: 08/14/21  1615     History Chief Complaint  Patient presents with   Urinary Retention    Ralph Martinez is a 70 y.o. male.  HPI      Ralph Martinez is a 70 y.o. male with past medical history of gout, hypertension, prior stroke, and kidney stones who presents to the Emergency Department complaining of acute urinary retention since 10 AM this morning.  He was seen by urology, Dr. Jeffie Pollock on 08/09/2021 for cystoscopy and ureteroscopy with stent placement of the right ureter secondary to right ureteral and bladder stone.  He was found to have multiple small bladder stones with the largest being 1.1 cm and a 11 mm right mid to distal ureteral stone.  Patient reports history of multiple stones since in his 51s.  He was advised that he could remove the stent on Monday morning and he done so without difficulty.  States he was urinating well until this morning at 10 AM.  Began having diffuse pain of his lower abdomen and feeling urge to urinate and defecate.  Pain gradually worsened.  He denies any chest pain, shortness of breath, nausea, vomiting or flank pain.    Past Medical History:  Diagnosis Date   Basal cell carcinoma 2022   left shoulder   COVID-19 06/30/2020   treated w/ infusion   Gout    High cholesterol    Hypertension    Kidney stones    Mild cognitive impairment 07/30/2020   per Dr. Leonie Man OV note on 07/30/2020   Stroke ([place]) 12/01/2018   R thalamic s/p IV TPA    Patient Active Problem List   Diagnosis Date Noted   Essential hypertension 12/02/2018   Hyperlipidemia LDL goal <70 12/02/2018   Renal insufficiency 12/02/2018   CVA (cerebral vascular accident) ([place]) R thalamic s/p IV tPA 12/01/2018   Acute cholecystitis 07/13/2016    Past Surgical History:  Procedure Laterality Date   CHOLECYSTECTOMY N/A 07/14/2016   Procedure: LAPAROSCOPIC CHOLECYSTECTOMY;  Surgeon: Vickie Epley,  MD;  Location: [facility];  Service: General;  Laterality: N/A;   CYSTOSCOPY WITH LITHOLAPAXY Right 08/09/2021   Procedure: CYSTOSCOPY WITH LITHOLAPAXY RIGHT RETROGRADE RIGHT URETEROSCOPY WIHT LASER AND STENT;  Surgeon: Irine Seal, MD;  Location: [facility];  Service: Urology;  Laterality: Right;   KIDNEY STONE SURGERY         Family History  Problem Relation Age of Onset   Hypertension Mother    Hypertension Father    Rheum arthritis Sister     Social History   Tobacco Use   Smoking status: Former    Packs/day: 0.50    Years: 7.00    Pack years: 3.50    Types: Cigarettes    Quit date: 07/22/1978    Years since quitting: 43.0   Smokeless tobacco: Former    Types: Snuff, Chew    Quit date: 2000  Vaping Use   Vaping Use: Never used  Substance Use Topics   Alcohol use: Not Currently    Comment: occasional   Drug use: No    Home Medications Prior to Admission medications   Medication Sig Start Date End Date Taking? Authorizing Provider  amLODipine (NORVASC) 5 MG tablet Take 5 mg by mouth every evening.    [provider]  aspirin EC 81 MG EC tablet Take 1 tablet (81 mg total) by mouth daily. 12/02/18  Donzetta Starch, NP  atorvastatin (LIPITOR) 40 MG tablet Take 1 tablet (40 mg total) by mouth daily at 6 PM. Patient taking differently: Take 40 mg by mouth daily. 12/02/18   Donzetta Starch, NP  fexofenadine (ALLEGRA) 60 MG tablet Take 60 mg by mouth 2 (two) times daily.    [provider]  HYDROcodone-acetaminophen (NORCO/VICODIN) 5-325 MG tablet Take 2 tablets by mouth every 6 (six) hours as needed for moderate pain. 08/09/21 08/09/22  Irine Seal, MD  phenazopyridine (PYRIDIUM) 200 MG tablet Take 1 tablet (200 mg total) by mouth 3 (three) times daily as needed for pain. 08/09/21   Irine Seal, MD  valsartan (DIOVAN) 320 MG tablet Take 320 mg by mouth daily. 10/19/18   [provider]    Allergies    Codeine and Erythromycin  Review of  Systems   Review of Systems  Constitutional:  Negative for chills, fatigue and fever.  Respiratory:  Negative for shortness of breath and wheezing.   Cardiovascular:  Negative for chest pain.  Gastrointestinal:  Positive for abdominal pain. Negative for nausea and vomiting.  Genitourinary:  Positive for difficulty urinating. Negative for dysuria, flank pain, hematuria, scrotal swelling and testicular pain.  Musculoskeletal:  Negative for arthralgias, back pain, myalgias, neck pain and neck stiffness.  Skin:  Negative for rash.  Neurological:  Negative for dizziness, weakness and numbness.  Hematological:  Does not bruise/bleed easily.   Physical Exam Updated Vital Signs BP (!) 146/89 (BP Location: Left Arm)    Pulse 76    Temp 97.7 F (36.5 C) (Oral)    Resp 18    Ht 6' (1.829 m)    Wt 95.3 kg    SpO2 97%    BMI 28.48 kg/m   Physical Exam Vitals and nursing note reviewed.  Constitutional:      Appearance: Normal appearance.     Comments: Uncomfortable appearing, patient is doubled over on the stretcher, writhing in pain  Cardiovascular:     Rate and Rhythm: Normal rate and regular rhythm.     Pulses: Normal pulses.  Pulmonary:     Effort: Pulmonary effort is normal.     Breath sounds: Normal breath sounds.  Abdominal:     General: There is distension.     Tenderness: There is abdominal tenderness.     Comments: Diffuse tenderness of the lower abdomen.  Mid lower abdomen feels distended on exam.  No CVA tenderness  Musculoskeletal:     Right lower leg: No edema.     Left lower leg: No edema.  Skin:    General: Skin is warm.     Capillary Refill: Capillary refill takes less than 2 seconds.  Neurological:     General: No focal deficit present.     Sensory: No sensory deficit.     Motor: No weakness.    ED Results / Procedures / Treatments   Labs (all labs ordered are listed, but only abnormal results are displayed) Labs Reviewed  COMPREHENSIVE METABOLIC PANEL - Abnormal;  Notable for the following components:      Result Value   Glucose, Bld 172 (*)    All other components within normal limits  CBC WITH DIFFERENTIAL/PLATELET - Abnormal; Notable for the following components:   WBC 14.9 (*)    Neutro Abs 13.6 (*)    Lymphs Abs 0.6 (*)    All other components within normal limits  URINALYSIS, ROUTINE W REFLEX MICROSCOPIC - Abnormal; Notable for the following components:  Hgb urine dipstick MODERATE (*)    Nitrite POSITIVE (*)    All other components within normal limits  URINE CULTURE    EKG None  Radiology CT Renal Stone Study  Result Date: 08/14/2021 CLINICAL DATA:  Urinary retention. EXAM: CT ABDOMEN AND PELVIS WITHOUT CONTRAST TECHNIQUE: Multidetector CT imaging of the abdomen and pelvis was performed following the standard protocol without IV contrast. COMPARISON:  July 24, 2021 FINDINGS: Lower chest: Very mild atelectasis is seen along the posterior aspect of the bilateral lung bases. Hepatobiliary: No focal liver abnormality is seen. Status post cholecystectomy. No biliary dilatation. Pancreas: Unremarkable. No pancreatic ductal dilatation or surrounding inflammatory changes. Spleen: Normal in size without focal abnormality. Adrenals/Urinary Tract: Adrenal glands are unremarkable. Kidneys are normal in size. A 1.2 cm diameter exophytic cyst is seen along the posterior aspect of the mid left kidney. A 1.3 cm diameter cyst is also seen within the posterior aspect of the mid right kidney. Innumerable bilateral nonobstructing renal calculi are noted (the largest measures approximately 10 mm). Prominent bilateral extrarenal pelvis are seen. A 5 mm obstructing renal calculus is seen within the mid to distal right ureter (axial CT image 71, CT series 2) with moderate severity right-sided hydroureter. A Foley catheter is seen within an empty urinary bladder. Stomach/Bowel: Stomach is within normal limits. Appendix appears normal. A moderate to marked amount of  stool is seen within the mid to distal sigmoid colon and rectum. No evidence of bowel wall thickening, distention, or inflammatory changes. Vascular/Lymphatic: Aortic atherosclerosis. No enlarged abdominal or pelvic lymph nodes. Reproductive: Prostate is unremarkable. Other: No abdominal wall hernia or abnormality. No abdominopelvic ascites. Musculoskeletal: Degenerative changes seen throughout the lumbar spine with severe anterior disc bulges noted at the levels of L4-L5 and L5-S1. IMPRESSION: 1. 5 mm obstructing renal calculus within the mid to distal right ureter. 2. Innumerable bilateral nonobstructing renal calculi. 3. Evidence of prior cholecystectomy. 4. Aortic atherosclerosis. Aortic Atherosclerosis (ICD10-I70.0). Electronically Signed   By: Virgina Norfolk M.D.   On: 08/14/2021 19:43    Procedures Procedures   Medications Ordered in ED Medications  HYDROmorphone (DILAUDID) injection 1 mg (1 mg Intravenous Given 08/14/21 1810)  ondansetron (ZOFRAN) injection 4 mg (4 mg Intravenous Given 08/14/21 1808)  cefTRIAXone (ROCEPHIN) 1 g in sodium chloride 0.9 % 100 mL IVPB (0 g Intravenous Stopped 08/14/21 2252)  ketorolac (TORADOL) 30 MG/ML injection 30 mg (30 mg Intravenous Given 08/14/21 2140)  ondansetron (ZOFRAN) injection 4 mg (4 mg Intravenous Given 08/14/21 2141)    ED Course  I have reviewed the triage vital signs and the nursing notes.  Pertinent labs & imaging results that were available during my care of the patient were reviewed by me and considered in my medical decision making (see chart for details).    MDM Rules/Calculators/A&P                         Patient here with lower abdominal pain and urinary retention of sudden onset at 10 AM this morning.  Recent surgery for cystoscopy and right-sided ureteral stent placement secondary to ureteral and bladder stones.  Stent was removed by patient 2 days ago.  Has been urinating without difficulty until this morning.  On exam,  patient appears very uncomfortable writhing around on the stretcher.  Lower abdomen feels distended.  Will place Foley catheter and bladder scan  Bladder scan shows 600 cc urine  Foley catheter was placed, patient had 800 cc of  dark yellow urine quickly flowing into the catheter bag.  No blood clots seen.  Patient reports immediate relief.  On recheck, patient reports return of some mild back pain and requesting antiemetic and additional pain medication.  IV Toradol ordered.  Labs show normal serum creatinine.  Given patient's recent procedure and acute onset of urinary retention will consult with urology.  Labs interpreted by me, leukocytosis of 14,000, no electrolyte derangement, urinalysis shows moderate hemoglobin and nitrate positive with 6-10 white cells.  Urine culture pending.  He will be given IV Rocephin here.  Discussed findings with urology, Dr. Alinda Money, recommends to leave Foley catheter in place and if patient's pain is controlled he may follow-up with urology tomorrow as previously scheduled.  Patient has been observed in the department without further complications.  He has had approximately 1200 cc of urine drained into the Foley catheter bag.  He denies any pain or other symptoms at this time.  I feel that he is appropriate for discharge home will provide short course of pain medication and he will follow-up with urology tomorrow.  Return precautions were discussed, all questions were answered.      Final Clinical Impression(s) / ED Diagnoses Final diagnoses:  Acute urinary retention  Ureterolithiasis    Rx / DC Orders ED Discharge Orders     None        Bufford Lope 08/14/21 2307    Noemi Chapel, MD 08/14/21 2359

## 2021-08-15 MED FILL — Oxycodone w/ Acetaminophen Tab 5-325 MG: ORAL | Qty: 6 | Status: AC

## 2021-08-16 LAB — URINE CULTURE: Culture: NO GROWTH

## 2022-04-12 IMAGING — DX DG FOOT COMPLETE 3+V*L*
3 series · 3 of 3 positions shown · non-contrast
Comparison: None.

CLINICAL DATA: Left foot pain

EXAM:
LEFT FOOT - COMPLETE 3+ VIEW

[foot ap]
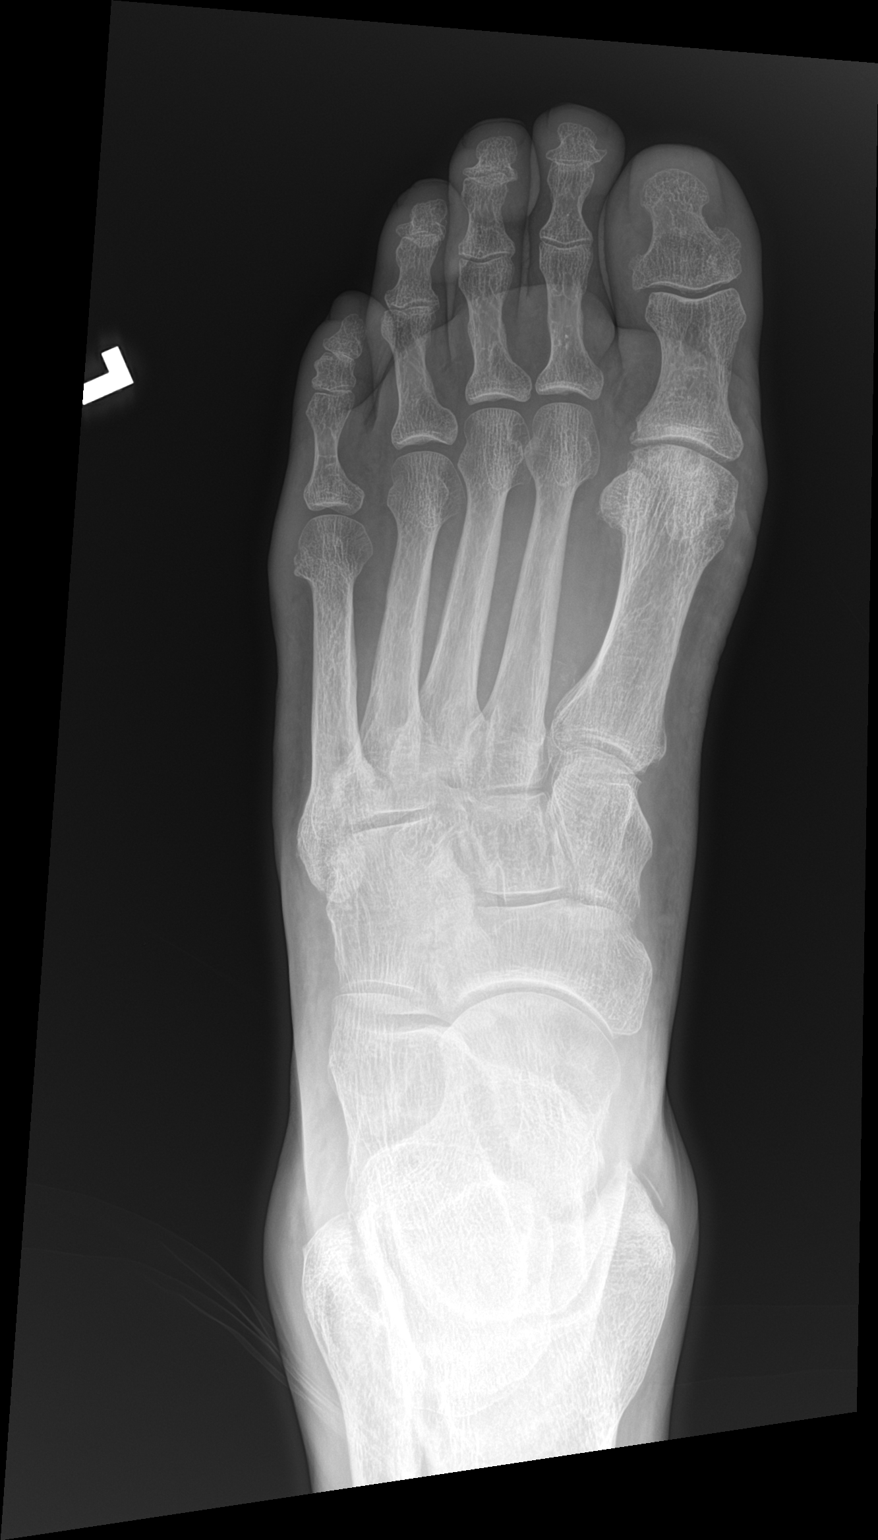

[foot obl]
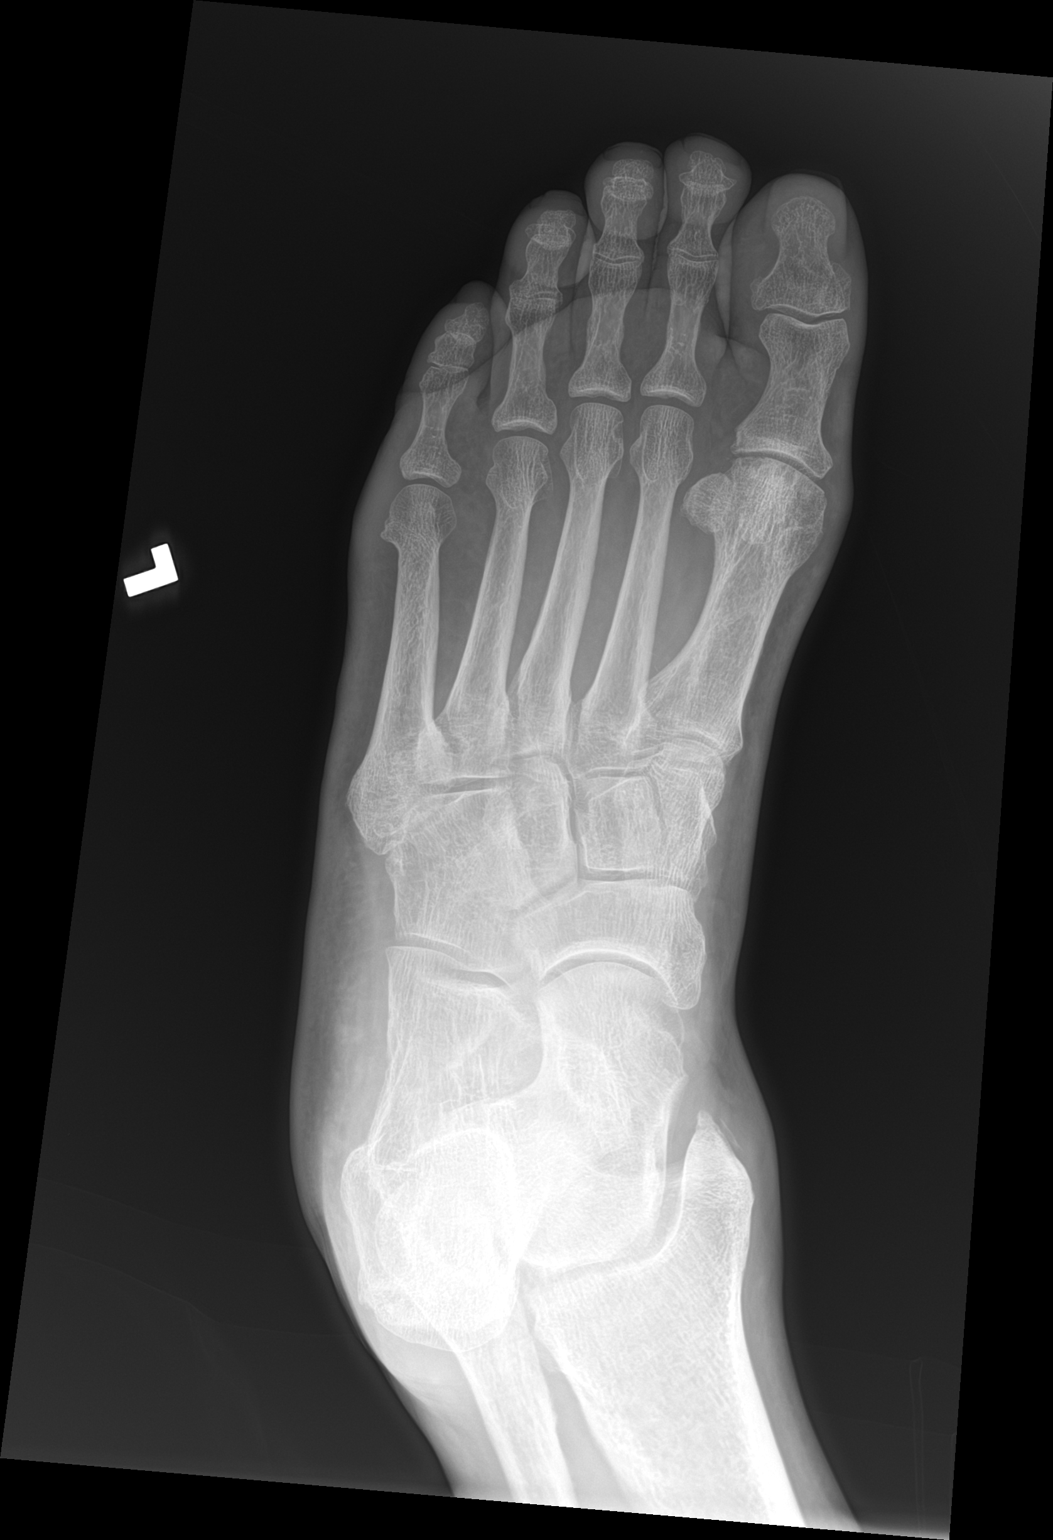

[foot lat]
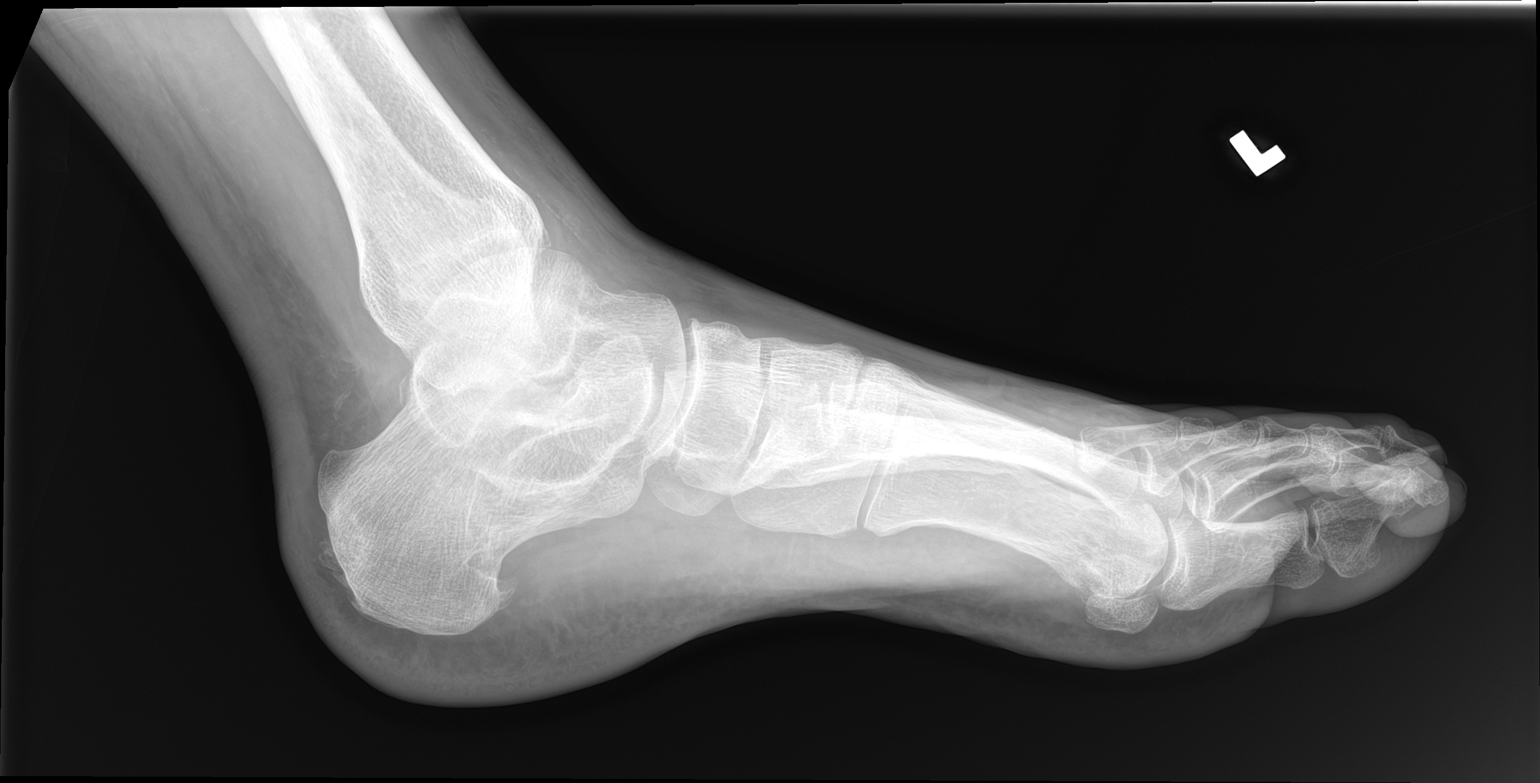

[3 of 3 positions shown; findings below may reference images not displayed]

FINDINGS: Normal alignment. No acute fracture or dislocation. Mild
degenerative arthritis of the first MTP joint small superior and
plantar calcaneal spurs. Vascular calcifications are seen within the
left ankle. Soft tissues are otherwise unremarkable.
IMPRESSION: No acute fracture or dislocation

## 2022-04-13 IMAGING — US US EXTREM LOW VENOUS*L*
1 series · 14 of 24 positions shown · non-contrast
Comparison: 08/25/2016

CLINICAL DATA: Left leg pain and swelling.

EXAM:
LEFT LOWER EXTREMITY VENOUS DOPPLER ULTRASOUND
TECHNIQUE: Gray-scale sonography with compression, as well as color and duplex
ultrasound, were performed to evaluate the deep venous system(s)
from the level of the common femoral vein through the popliteal and
proximal calf veins.

[Series 1: us venous img lower uni left (dvt) · portal-venous · 14 of 40 slices shown]
[im 1/40]
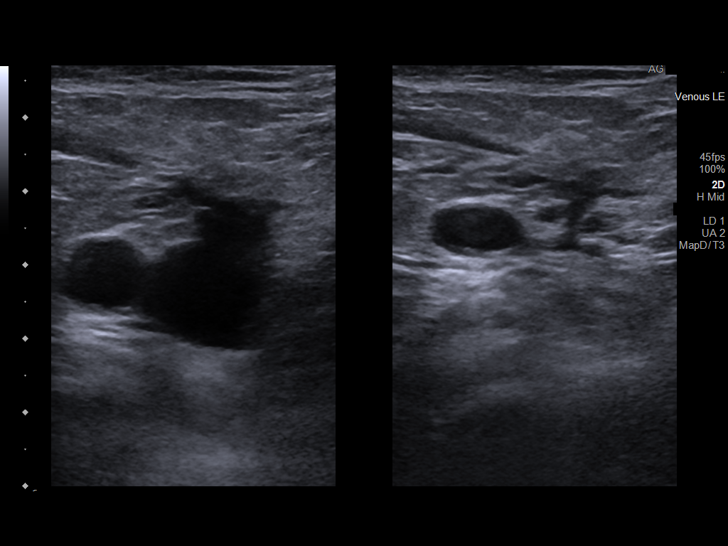
[im 4/40]
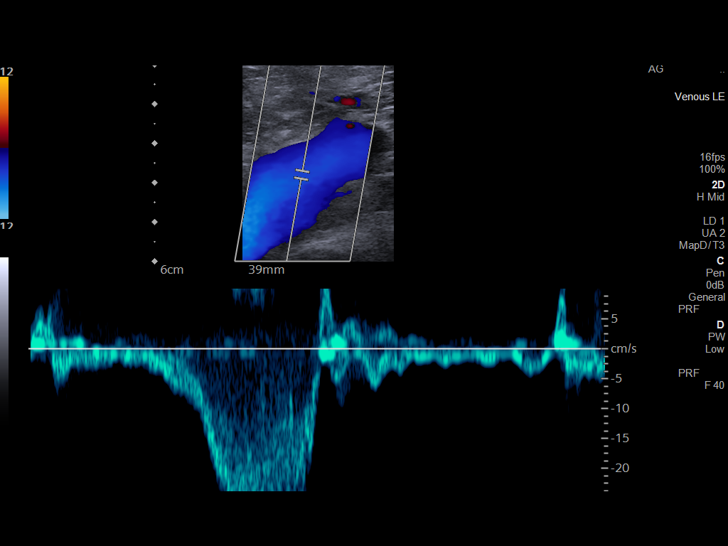
[im 7/40]
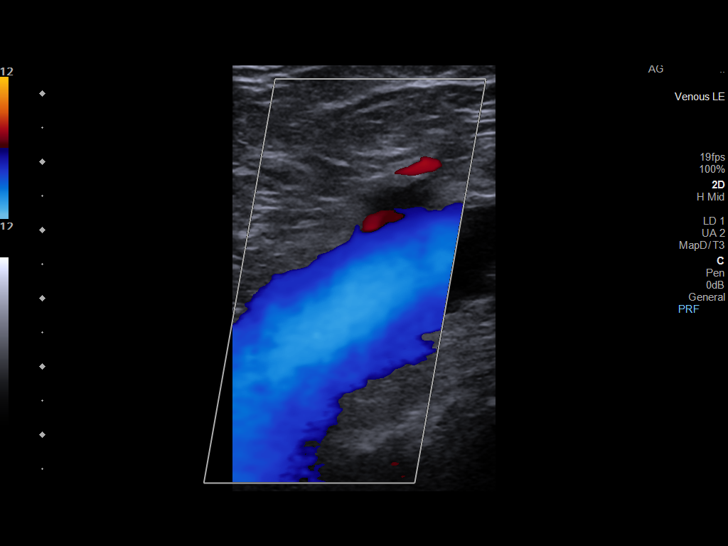
[im 11/40]
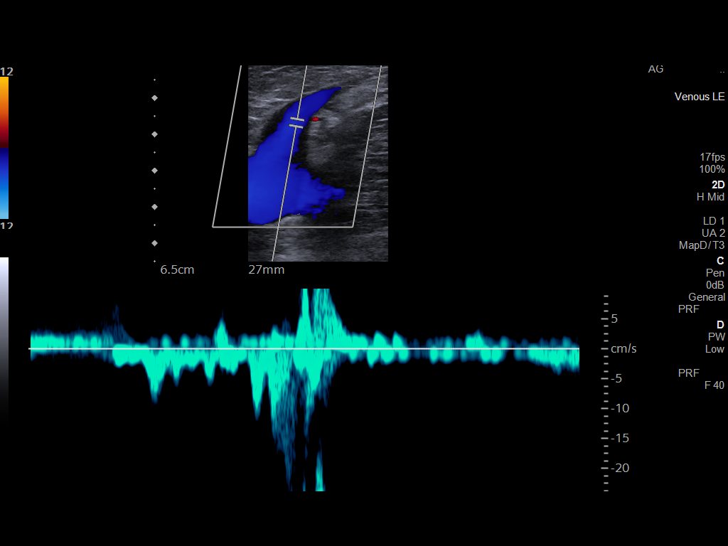
[im 12/40]
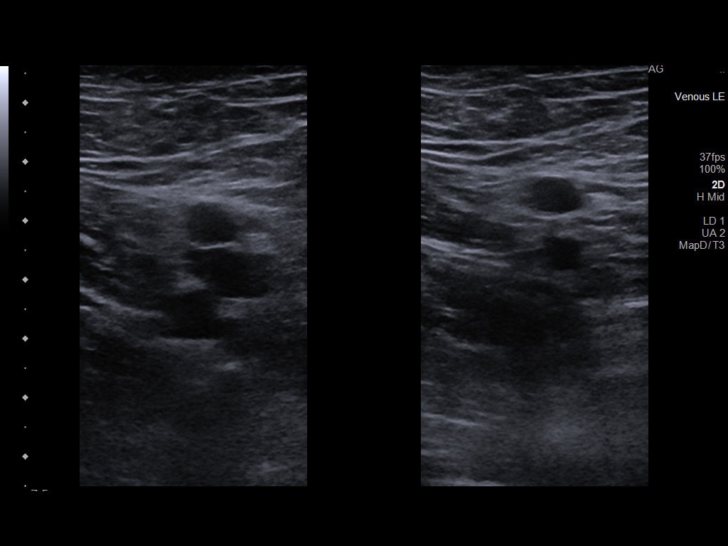
[im 16/40]
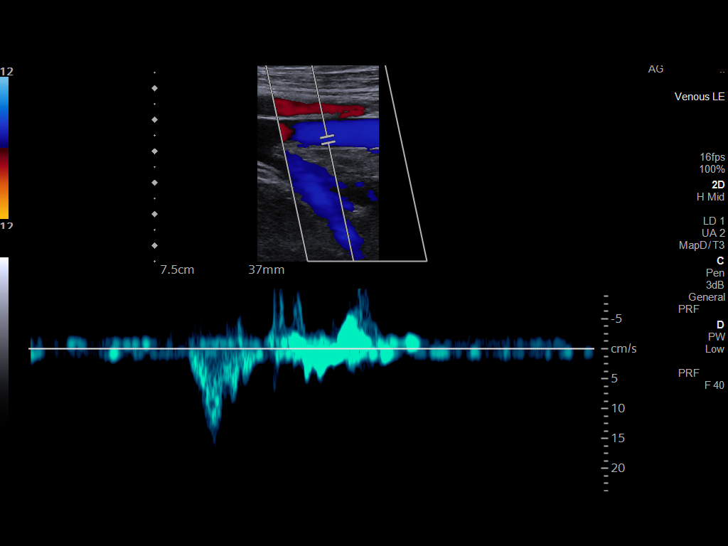
[im 19/40]
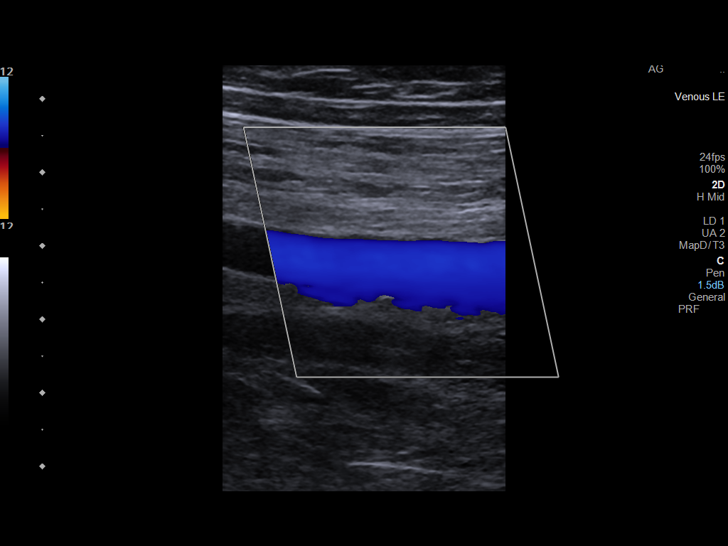
[im 21/40]
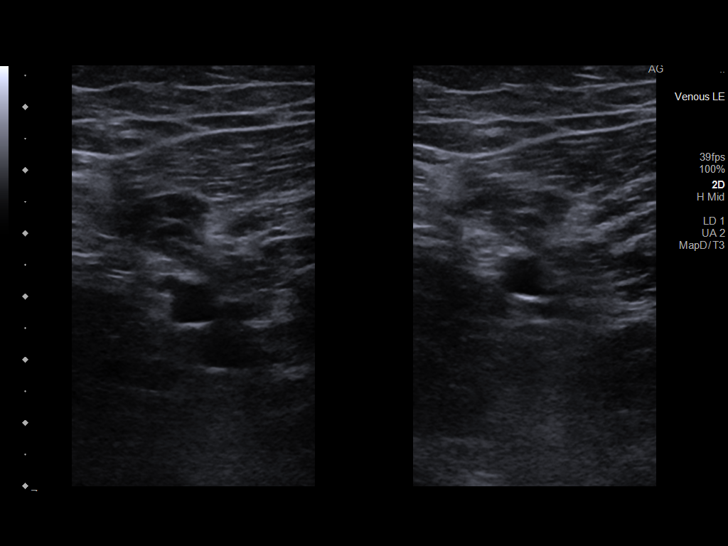
[im 24/40]
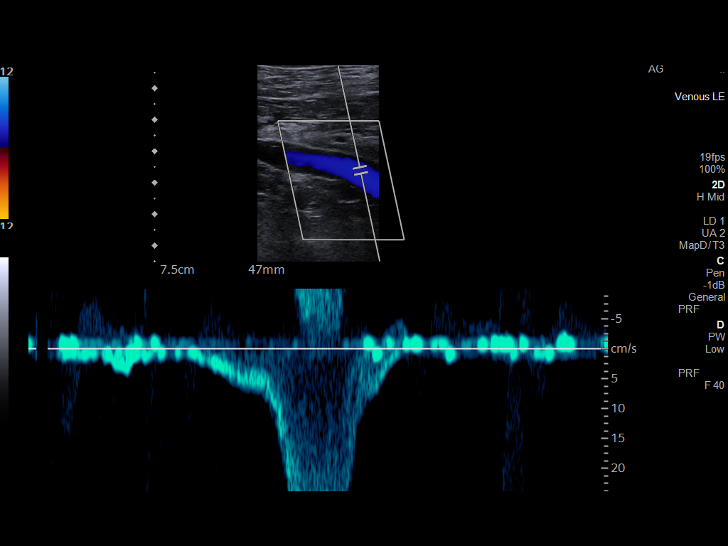
[im 28/40]
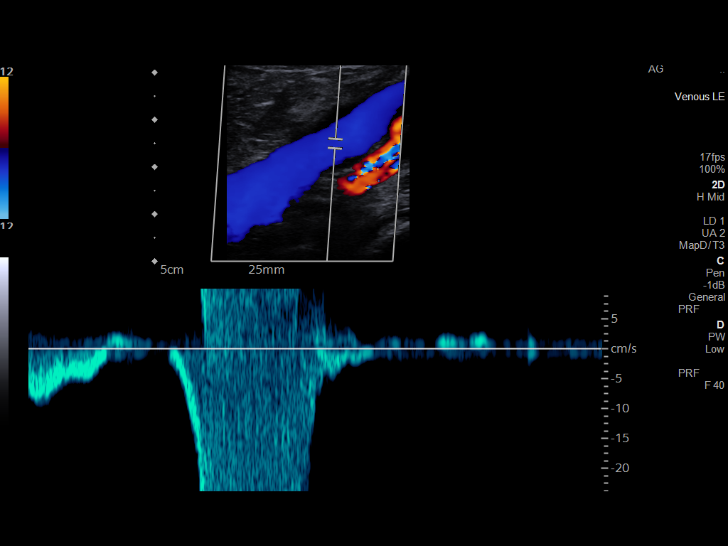
[im 31/40]
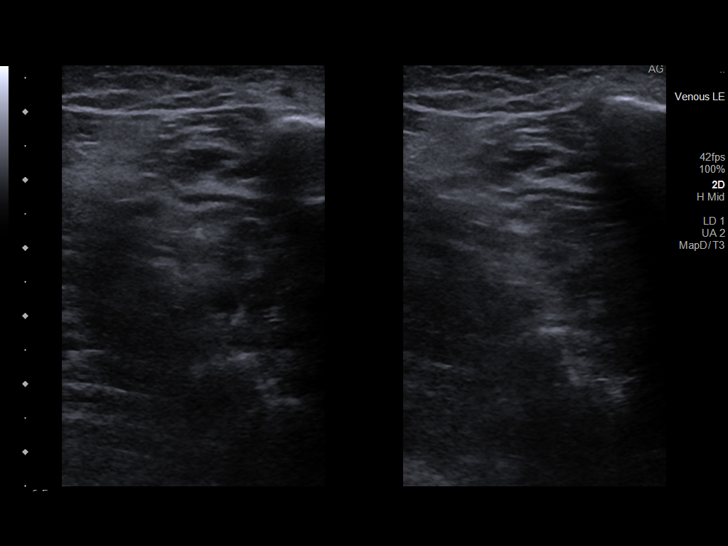
[im 33/40]
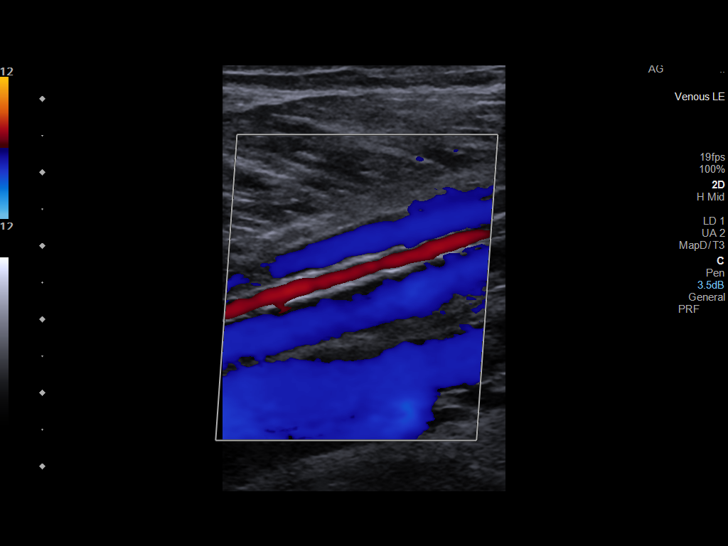
[im 36/40]
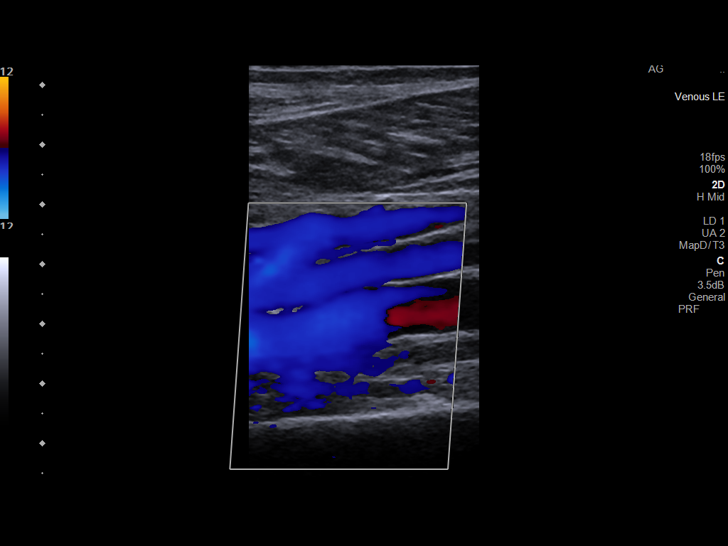
[im 40/40]
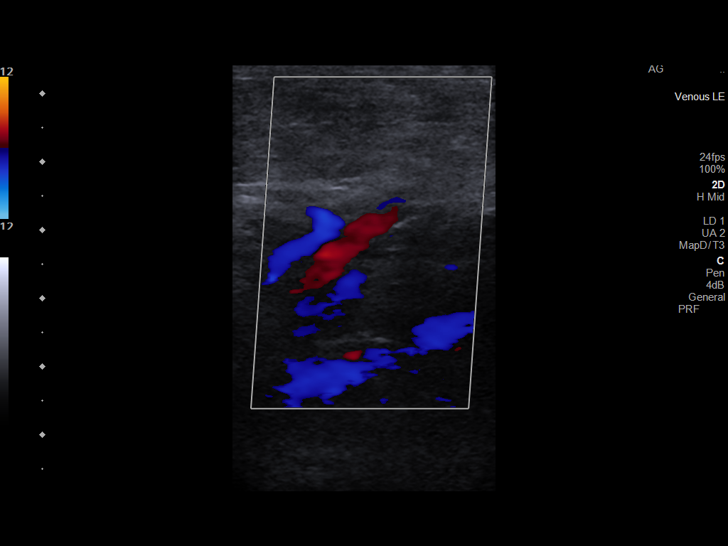

[14 of 24 positions shown; findings below may reference images not displayed]

FINDINGS: VENOUS

Normal compressibility of the common femoral, superficial femoral,
and popliteal veins, as well as the visualized calf veins.
Visualized portions of profunda femoral vein and great saphenous
vein unremarkable. No filling defects to suggest DVT on grayscale or
color Doppler imaging. Doppler waveforms show normal direction of
venous flow, normal respiratory plasticity and response to
augmentation.

Limited views of the contralateral common femoral vein are
unremarkable.

OTHER

None.

Limitations: none
IMPRESSION: Negative.

## 2022-05-07 IMAGING — CT CT RENAL STONE PROTOCOL
2 of 4 series · 16 of 46 positions shown, 18 images · non-contrast
Comparison: July 24, 2021

CLINICAL DATA: Urinary retention.

EXAM:
CT ABDOMEN AND PELVIS WITHOUT CONTRAST
TECHNIQUE: Multidetector CT imaging of the abdomen and pelvis was performed
following the standard protocol without IV contrast.

[Series 2: axial st · axial · 0.95mm/px · z∈[+704,+1174]mm · 13 of 106 slices shown, 15 images]
[im 6/106  soft-tissue]
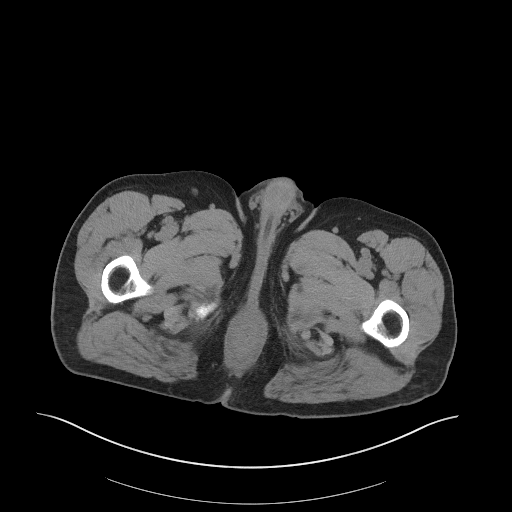
[im 6/106  bone]
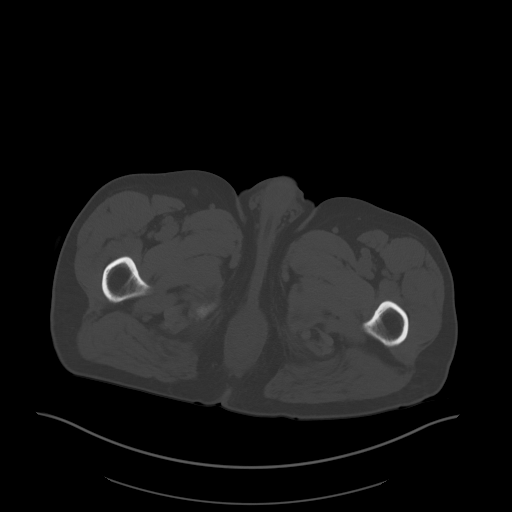
[im 12/106  soft-tissue]
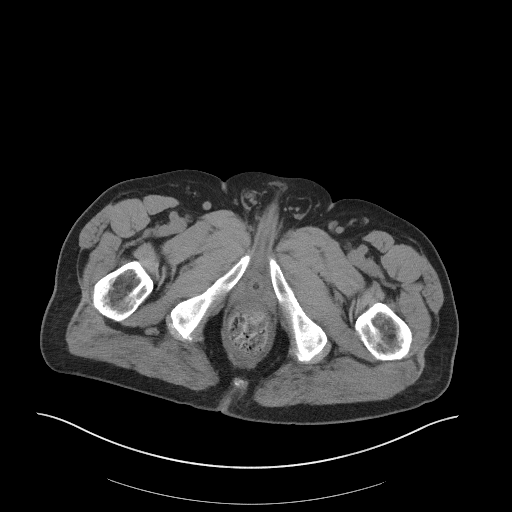
[im 24/106  soft-tissue]
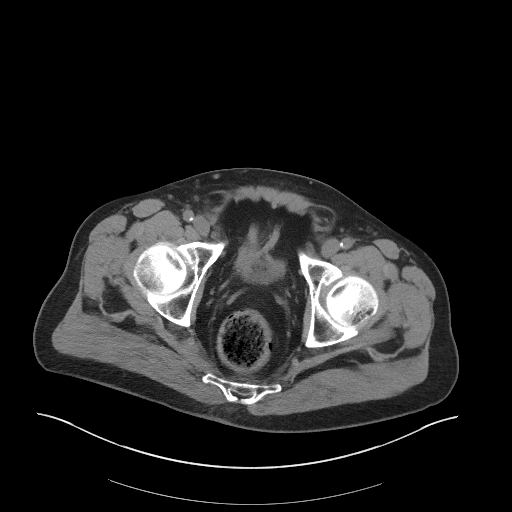
[im 30/106  soft-tissue]
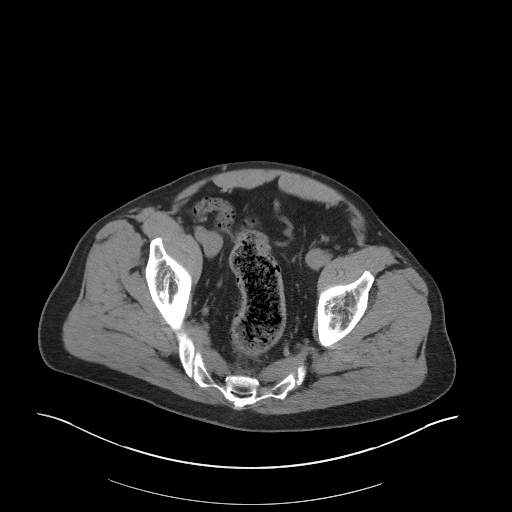
[im 36/106  soft-tissue]
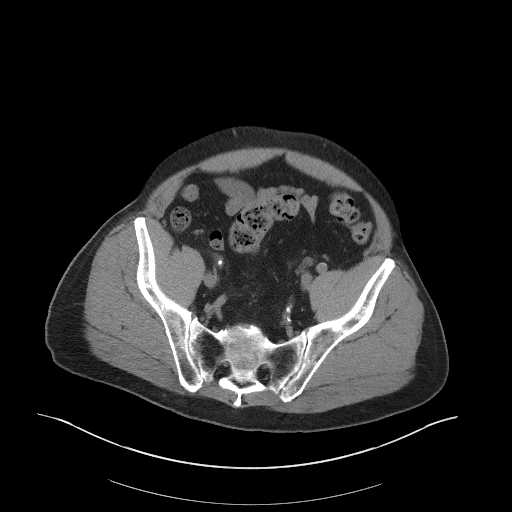
[im 47/106  soft-tissue]
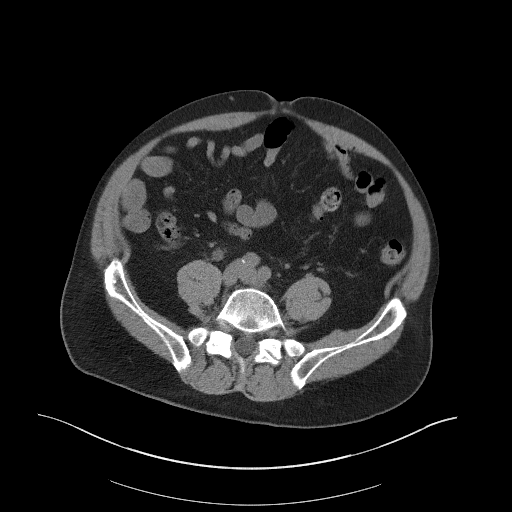
[im 53/106  soft-tissue]
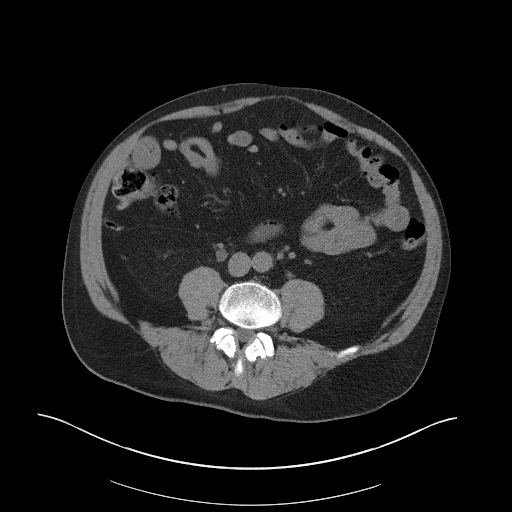
[im 59/106  soft-tissue]
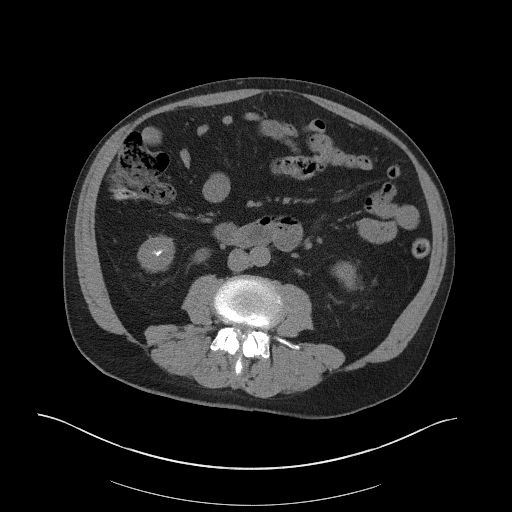
[im 71/106  soft-tissue]
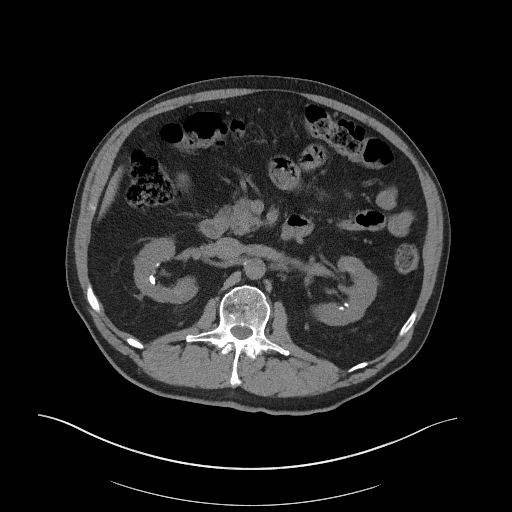
[im 71/106  bone]
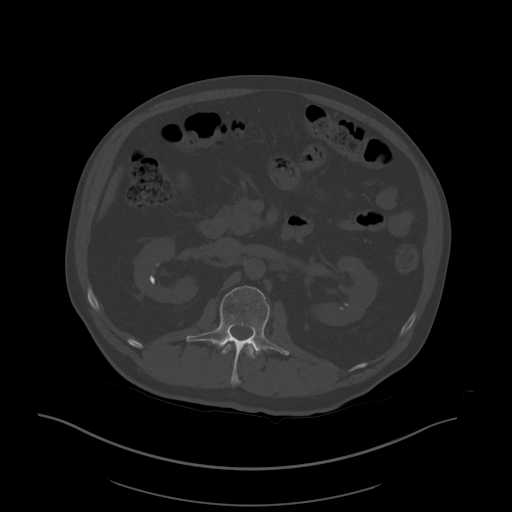
[im 76/106  soft-tissue]
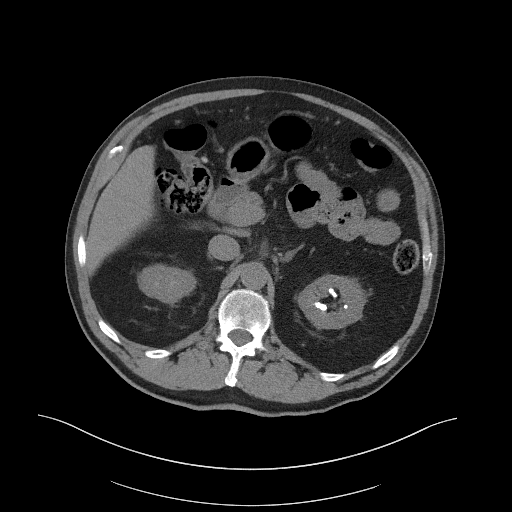
[im 82/106  soft-tissue]
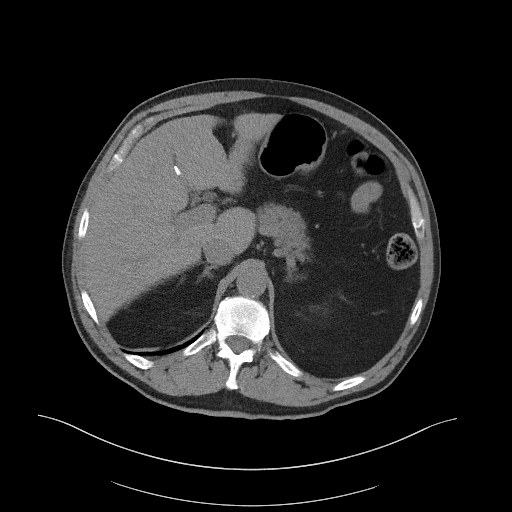
[im 94/106  soft-tissue]
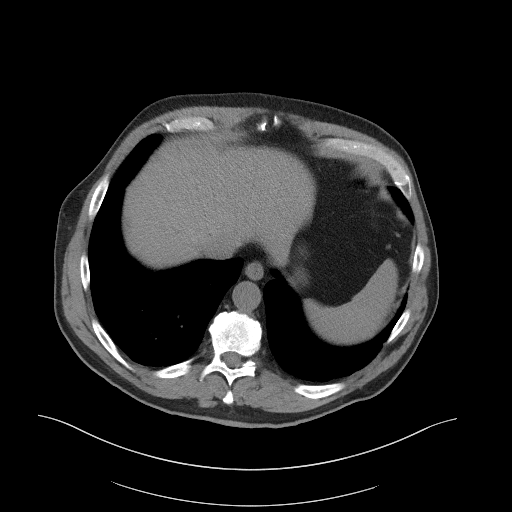
[im 100/106  soft-tissue]
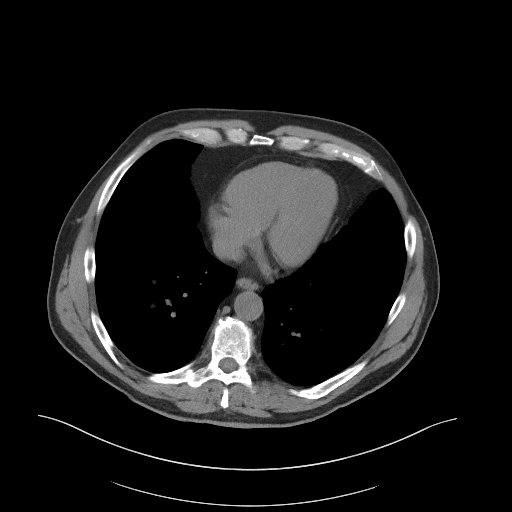

[Series 5: coronal st · coronal · 0.89mm/px · 3 of 122 slices shown]
[im 41/122  soft-tissue]
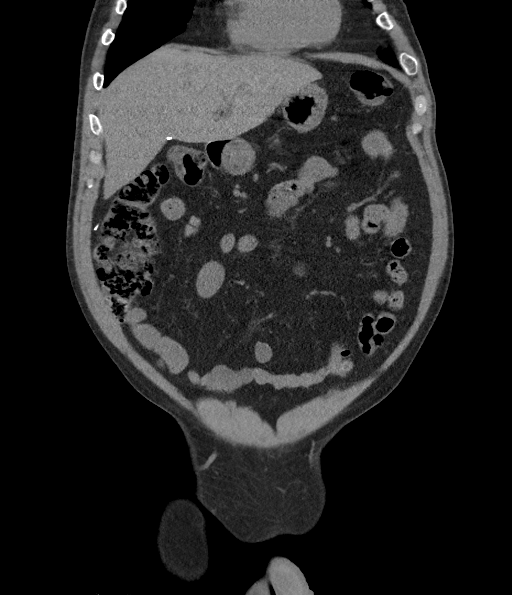
[im 54/122  soft-tissue]
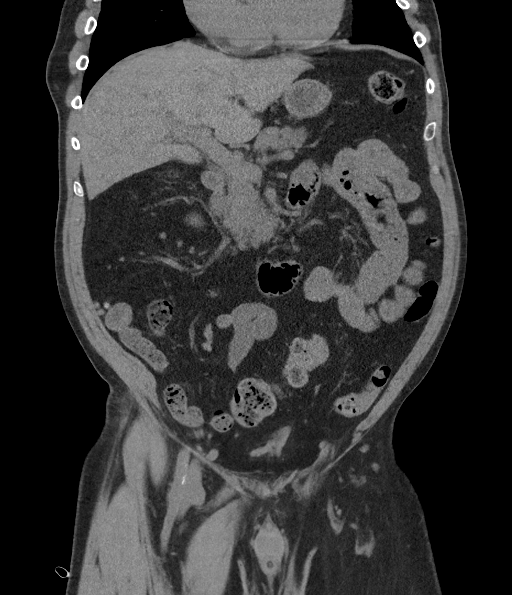
[im 68/122  soft-tissue]
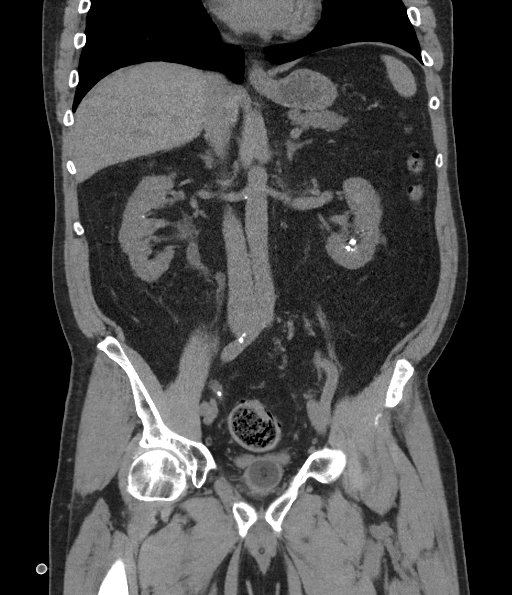

[16 of 46 positions shown; findings below may reference images not displayed]

FINDINGS: Lower chest: Very mild atelectasis is seen along the posterior
aspect of the bilateral lung bases.

Hepatobiliary: No focal liver abnormality is seen. Status post
cholecystectomy. No biliary dilatation.

Pancreas: Unremarkable. No pancreatic ductal dilatation or
surrounding inflammatory changes.

Spleen: Normal in size without focal abnormality.

Adrenals/Urinary Tract: Adrenal glands are unremarkable.

Kidneys are normal in size. A 1.2 cm diameter exophytic cyst is seen
along the posterior aspect of the mid left kidney. A 1.3 cm diameter
cyst is also seen within the posterior aspect of the mid right
kidney.

Innumerable bilateral nonobstructing renal calculi are noted (the
largest measures approximately 10 mm). Prominent bilateral
extrarenal pelvis are seen. A 5 mm obstructing renal calculus is
seen within the mid to distal right ureter (axial CT image 71, CT
series 2) with moderate severity right-sided hydroureter.

A Foley catheter is seen within an empty urinary bladder.

Stomach/Bowel: Stomach is within normal limits. Appendix appears
normal. A moderate to marked amount of stool is seen within the mid
to distal sigmoid colon and rectum. No evidence of bowel wall
thickening, distention, or inflammatory changes.

Vascular/Lymphatic: Aortic atherosclerosis. No enlarged abdominal or
pelvic lymph nodes.

Reproductive: Prostate is unremarkable.

Other: No abdominal wall hernia or abnormality. No abdominopelvic
ascites.

Musculoskeletal: Degenerative changes seen throughout the lumbar
spine with severe anterior disc bulges noted at the levels of L4-L5
and L5-S1.
IMPRESSION: 1. 5 mm obstructing renal calculus within the mid to distal right
ureter.
2. Innumerable bilateral nonobstructing renal calculi.
3. Evidence of prior cholecystectomy.
4. Aortic atherosclerosis.

Aortic Atherosclerosis ([id number]-[id number]).

## 2022-10-16 ENCOUNTER — Encounter: Payer: Self-pay | Admitting: Radiology

## 2022-11-03 ENCOUNTER — Encounter (INDEPENDENT_AMBULATORY_CARE_PROVIDER_SITE_OTHER): Payer: Self-pay | Admitting: *Deleted

## 2023-04-23 ENCOUNTER — Encounter (INDEPENDENT_AMBULATORY_CARE_PROVIDER_SITE_OTHER): Payer: Self-pay | Admitting: *Deleted

## 2023-06-30 ENCOUNTER — Encounter: Payer: Self-pay | Admitting: Neurology

## 2023-07-01 NOTE — Telephone Encounter (Signed)
Pt has appt tomorrow at 230 FYI

## 2023-07-02 ENCOUNTER — Encounter: Payer: Self-pay | Admitting: Neurology

## 2023-07-02 ENCOUNTER — Ambulatory Visit: Payer: Medicare HMO | Admitting: Neurology

## 2023-07-02 VITALS — BP 134/75 | HR 78 | Ht 72.0 in | Wt 219.6 lb

## 2023-07-02 DIAGNOSIS — R413 Other amnesia: Secondary | ICD-10-CM | POA: Diagnosis not present

## 2023-07-02 DIAGNOSIS — G309 Alzheimer's disease, unspecified: Secondary | ICD-10-CM

## 2023-07-02 DIAGNOSIS — F0280 Dementia in other diseases classified elsewhere without behavioral disturbance: Secondary | ICD-10-CM

## 2023-07-02 DIAGNOSIS — Z8673 Personal history of transient ischemic attack (TIA), and cerebral infarction without residual deficits: Secondary | ICD-10-CM | POA: Diagnosis not present

## 2023-07-02 DIAGNOSIS — F0150 Vascular dementia without behavioral disturbance: Secondary | ICD-10-CM

## 2023-07-02 MED ORDER — MEMANTINE HCL 28 X 5 MG & 21 X 10 MG PO TABS: ORAL_TABLET | ORAL | 12 refills | Status: DC

## 2023-07-02 MED ORDER — MEMANTINE HCL 10 MG PO TABS: 10.0000 mg | ORAL_TABLET | Freq: Two times a day (BID) | ORAL | 3 refills | Status: DC

## 2023-07-02 NOTE — Patient Instructions (Addendum)
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  I recommend treatment trial of Namenda starter pack to bring increase as tolerated to 10 mg twice daily.  He was also advised to increase participation in cognitively challenging activities like solving crossword puzzles, playing bridge and sudoku.  We also discussed memory compensation strategies.  Also recommend checking apoprotein E levels, brain PET scan with amyloid labeling, EEG and MRI scan, lipid profile and hemoglobin A1c.  Continue aspirin for stroke prevention and maintain aggressive risk factor modification with strict control of hypertension with blood pressure goal below 130/90, lipids with LDL cholesterol goal below 70 mg percent and diabetes with hemoglobin A1c goal below 6.5%.  He will return for follow-up in the future in 4 months or call earlier if necessary.  NAMENDA (Memantine)Tablets What is this medication? MEMANTINE (MEM an teen) treats memory loss and confusion (dementia) in people who have Alzheimer disease. It works by improving attention, memory, and the ability to engage in daily activities. It is not a cure for dementia or Alzheimer disease. This medicine may be used for other purposes; ask your health care provider or pharmacist if you have questions. COMMON BRAND NAME(S): Namenda What should I tell my care team before I take this medication? They need to know if you have any of these conditions: Kidney disease Liver disease Seizures Trouble passing urine An unusual or allergic reaction to memantine, other medications, foods, dyes, or preservatives Pregnant or trying to get pregnant Breast-feeding How should I use this medication? Take this medication by mouth with water. Follow the directions on the prescription label. You may take this medication with or without food. Take your doses at regular intervals. Do  not take your medication more often than directed. Continue to take your medication even if you feel better. Do not stop taking except on the advice of your care team. Talk to your care team about the use of this medication in children. Special care may be needed Overdosage: If you think you have taken too much of this medicine contact a poison control center or emergency room at once. NOTE: This medicine is only for you. Do not share this medicine with others. What if I miss a dose? If you miss a dose, take it as soon as you can. If it is almost time for your next dose, take only that dose. Do not take double or extra doses. If you do not take your medication for several days, contact your care team. Your dose may need to be changed. What may interact with this medication? Acetazolamide Amantadine Cimetidine Dextromethorphan Dofetilide Hydrochlorothiazide Ketamine Metformin Methazolamide Quinidine Ranitidine Sodium bicarbonate Triamterene This list may not describe all possible interactions. Give your health care provider a list of all the medicines, herbs, non-prescription drugs, or dietary supplements you use. Also tell them if you smoke, drink alcohol, or use illegal drugs. Some items may interact with your medicine. What should I watch for while using this medication? Visit your care team for regular checks on your progress. Check with your care team if there is no improvement in your symptoms or if they get worse. This medication may affect your coordination, reaction time, or judgment. Do not drive or operate machinery until you know how this medication affects you. Sit up or stand slowly to reduce the risk of dizzy or fainting spells. Drinking alcohol with this medication can increase the risk  of these side effects. What side effects may I notice from receiving this medication? Side effects that you should report to your care team as soon as possible: Allergic reactions--skin rash,  itching, hives, swelling of the face, lips, tongue, or throat Side effects that usually do not require medical attention (report to your care team if they continue or are bothersome): Confusion Constipation Diarrhea Dizziness Headache This list may not describe all possible side effects. Call your doctor for medical advice about side effects. You may report side effects to FDA at 1-800-FDA-1088. Where should I keep my medication? Keep out of the reach of children. Store at room temperature between 15 degrees and 30 degrees C (59 degrees and 86 degrees F). Throw away any unused medication after the expiration date. NOTE: This sheet is a summary. It may not cover all possible information. If you have questions about this medicine, talk to your doctor, pharmacist, or health care provider.  2024 Elsevier/Gold Standard (2021-08-27 00:00:00)

## 2023-07-02 NOTE — Progress Notes (Addendum)
[facility] [address] [place]. [place] [address] [phone]       OFFICE FOLLOW-UP NOTE  [name] Date of Birth:  1950-11-26 Medical Record Number:  [id number]   HPI: [name] is a 67-year African-American male seen today for initial office follow-up visit following hospital admission for stroke in April 2020.  History is obtained from the patient and review of electronic medical records.  I personally reviewed imaging films. Ralph Martinez is a 72 y.o. male with history of kidney stones, hypertension, hypercholesterolemia.  Patient states that he got up around 6 AM this morning 12/01/2018 without symptoms.  He was making a pot of coffee and took his blood pressure medications.  At approximately 7:00 (LKW 715) he developed numbness that went from his leg up to his arm which then proceeded to include his face with significant left leg weakness.  He denies any other symptoms.  He did call EMS which brought him to [facility].  Patient was administered TPA there and sent to [facility] for further monitoring.  Upon arrival patient had significantly improved.  The only findings were a left facial droop.  Patient feels as though he is back to normal.  He denies taking aspirin on a daily basis. Premorbid modified Rankin scale (mRS): 0. NIH stroke score: 1. CT scan of the bed brain showed no acute abnormality.  MRI scan showed a small right thalamic lacunar infarct.  MRI of the brain showed no significant large vessel intracranial stenosis.  Carotid ultrasound showed no significant extracranial stenosis.  LDL cholesterol was elevated at 103 mg percent.  Hemoglobin A1c was 5.4.  Patient was started on aspirin 81 and Plavix 75 mg daily for 3 weeks followed by aspirin alone.  He states is done well since discharge.  He still gets occasional intermittent left leg numbness but this is transient and appears to be more prominent when he is tired or exhausted.  He has had no recurrent  stroke or TIA symptoms.  He states his blood pressure is well controlled.  It is 147/81 today in our office.  The patient states that interestingly he had removed several ticks from his legs few days prior to his stroke symptoms.  He however did not have the classical erythematous rash or arthralgias from tick bite.  Patient states is tolerating aspirin well without bruising or bleeding.  Is also tolerating Lipitor well without muscle aches and pains.  He is quite physically active and exercises every day.  He is eating healthy and has not gained any weight.  The patient has been dipping snuff but he knows that he has to quit that. Update 07/21/2019 ; he returns for follow-up after last visit 6 months ago.  Continues to do well without recurrent stroke or TIA symptoms.  He still has intermittent paresthesias in his left leg but these are not as disabling and appear to be improving compared to last visit.  He is pretty active and is able to do almost everything that he did before the stroke.  He remains on aspirin which is tolerating well without bleeding or bruising.  Blood pressures well controlled and today it is 130/80.  He remains on Lipitor which is tolerating well without muscle aches and pain.  Follow-up LDL cholesterol on 01/19/2019 was 70 mg percent.  He has no new complaints today. Update 07/30/2020 : He returns for follow-up after last visit a year ago.  The patient is alone but he  is accompanied by his wife whom he did not permit to come into the room with her.  However she slipped me is note stating that she is concerned that patient has been having some paranoia recently thinks people are watching the house and yelling at him.  He is having some bad mood swings as well gets angry over little things.  He has trouble remembering recent recent information and our 2-hour he often loses things.  Wife is concerned about dementia.  She also feels that he stumbles a lot.  Patient himself denies any symptoms.   He denies any new stroke or TIA-like symptoms.  On the Mini-Mental status exam he scored 26 with only deficits in attention calculation.  On depression scale he was not found to be depressed.  His is tolerating aspirin well without bruising bleeding.  Blood pressures well controlled and today it is 139/81.  Is remains on Lipitor which is tolerating well without muscle aches and pains.  He has an upcoming visit with his primary care physician next month for annual physical and will have lab work done.  His last carotid ultrasound was done on 08/04/2019 was unremarkable. Update 07/02/2023 : He returns for follow-up after last visit nearly 3 years ago.  He is accompanied by his daughter.  They have noticed worsening of memory and cognitive difficulties gradually.  He has trouble remembering recent information and has to be reminded about appointments and taking his medicines constantly.  He is still quite independent and lives at home with his wife and daughter and manage his activities of daily living.  He is still driving.  He has never gotten lost.  He did denies any hallucinations, delusions or unsafe behavior.  He has not exhibited any agitation or violent behavior.  He has not had any recent brain imaging done.  At last visit I had ordered lab work and B12, TSH, RPR mecysteine was normal.  I recommend he take Cerefolin NAC.  He has not been taking it.  He has not had recurrent stroke or TIA symptoms.  He remains on aspirin which is tolerating well without bleeding or bruising.  He is tolerating Lipitor well without muscle aches and pains.  His blood pressure is well-controlled on Diovan.  He has not had any recent lab work for cholesterol or diabetes check.  He does have family history of dementia in his father in his early 43s.  He recently had a flareup of his gout in his foot and is on prednisone. ROS:   14 system review of systems is positive for numbness and tingling, paranoia, irritability, memory  difficulties, mood swings, getting angry, memory difficulties, stumbling only and all other systems negative  PMH:  Past Medical History:  Diagnosis Date   Basal cell carcinoma 2022   left shoulder   COVID-19 06/30/2020   treated w/ infusion   Gout    High cholesterol    Hypertension    Kidney stones    Mild cognitive impairment 07/30/2020   per Dr. Pearlean Brownie OV note on 07/30/2020   Stroke (HCC) 12/01/2018   R thalamic s/p IV TPA    Social History:  Social History   Socioeconomic History   Marital status: Married    Spouse name: Marjo Bicker   Number of children: 2   Years of education: Not on file   Highest education level: High school graduate  Occupational History   Occupation: retired    Comment: retired  Tobacco Use   Smoking status:  Former    Current packs/day: 0.00    Average packs/day: 0.5 packs/day for 7.0 years (3.5 ttl pk-yrs)    Types: Cigarettes    Start date: 07/23/1971    Quit date: 07/22/1978    Years since quitting: 44.9   Smokeless tobacco: Former    Types: Snuff, Chew    Quit date: 2000  Vaping Use   Vaping status: Never Used  Substance and Sexual Activity   Alcohol use: Not Currently    Comment: occasional   Drug use: No   Sexual activity: Not on file  Other Topics Concern   Not on file  Social History Narrative   Lives with wife and daughter   Caffeine- coffee 1 cup, 1 soda a day, green tea 1 glass   Social Determinants of Corporate investment banker Strain: Not on file  Food Insecurity: Not on file  Transportation Needs: Not on file  Physical Activity: Not on file  Stress: Not on file  Social Connections: Not on file  Intimate Partner Violence: Not on file    Medications:   Current Outpatient Medications on File Prior to Visit  Medication Sig Dispense Refill   allopurinol (ZYLOPRIM) 100 MG tablet Take 200 mg by mouth daily.     aspirin EC 81 MG EC tablet Take 1 tablet (81 mg total) by mouth daily.     atorvastatin (LIPITOR) 40 MG  tablet Take 1 tablet (40 mg total) by mouth daily at 6 PM. (Patient taking differently: Take 40 mg by mouth daily.) 30 tablet 2   fexofenadine (ALLEGRA) 60 MG tablet Take 60 mg by mouth 2 (two) times daily.     potassium citrate (UROCIT-K) 10 MEQ (1080 MG) SR tablet Take 10 mEq by mouth 3 (three) times daily.     predniSONE (STERAPRED UNI-PAK 21 TAB) 10 MG (21) TBPK tablet Take 10 mg by mouth daily.     tamsulosin (FLOMAX) 0.4 MG CAPS capsule Take 0.4 mg by mouth daily.     valsartan (DIOVAN) 320 MG tablet Take 320 mg by mouth daily.     amLODipine (NORVASC) 5 MG tablet Take 5 mg by mouth every evening. (Patient not taking: Reported on 07/02/2023)     cephALEXin (KEFLEX) 500 MG capsule Take 1 capsule (500 mg total) by mouth 4 (four) times daily. (Patient not taking: Reported on 07/02/2023) 28 capsule 0   oxyCODONE-acetaminophen (PERCOCET/ROXICET) 5-325 MG tablet Take 1 tablet by mouth every 6 (six) hours as needed for severe pain. (Patient not taking: Reported on 07/02/2023) 6 tablet 0   phenazopyridine (PYRIDIUM) 200 MG tablet Take 1 tablet (200 mg total) by mouth 3 (three) times daily as needed for pain. (Patient not taking: Reported on 07/02/2023) 15 tablet 1   No current facility-administered medications on file prior to visit.    Allergies:   Allergies  Allergen Reactions   Codeine Hives   Erythromycin Hives and Itching    Physical Exam General: Frail middle-aged African-American male, seated, in no evident distress Head: head normocephalic and atraumatic.  Neck: supple with no carotid or supraclavicular bruits Cardiovascular: regular rate and rhythm, no murmurs Musculoskeletal: no deformity Skin:  no rash/petichiae Vascular:  Normal pulses all extremities Vitals:   07/02/23 1405  BP: 134/75  Pulse: 78   Neurologic Exam Mental Status: Awake and fully alert. Oriented to place and time. Recent and remote memory intact. Attention span, concentration and fund of knowledge  appropriate. Mood and affect appropriate.  Diminished recall 2/3.  Able  to name 12 animals which can walk on 4 legs.  Clock drawing 344.  Mini-Mental status exam score 20/30 (decline from last visit 26/30  )with deficits mostly inattention and recall.  He had difficulty in copying intersecting pentagons. Cranial Nerves: Fundoscopic exam not done Pupils equal, briskly reactive to light. Extraocular movements full without nystagmus. Visual fields full to confrontation. Hearing intact. Facial sensation intact. Face, tongue, palate moves normally and symmetrically.  Motor: Normal bulk and tone. Normal strength in all tested extremity muscles. Sensory.: intact to touch ,pinprick .position and vibratory sensation.  But subjective paresthesias in the left leg lateral aspect. Coordination: Rapid alternating movements normal in all extremities. Finger-to-nose and heel-to-shin performed accurately bilaterally. Gait and Station: Arises from chair without difficulty. Stance is normal. Gait demonstrates normal stride length and balance . Not able to heel, toe and tandem walk without difficulty.  Reflexes: 1+ and symmetric. Toes downgoing.        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  He is doing well except for mild post stroke paresthesias which appear to be improving..  Vascular risk factors of hypertension hyperlipidemia and age only.  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  I recommend treatment trial of Namenda starter pack to bring increase as tolerated to 10 mg twice daily.  He was also advised to increase participation in cognitively challenging activities like solving crossword puzzles, playing bridge and sudoku.  We also discussed memory compensation strategies.  Also recommend checking apoprotein E levels, brain PET scan with amyloid labeling, EEG and MRI scan, lipid profile and hemoglobin A1c.  Continue aspirin for stroke prevention and maintain aggressive risk factor modification with strict control of hypertension with blood pressure goal below 130/90, lipids with LDL cholesterol goal below 70 mg percent and diabetes with hemoglobin A1c goal below 6.5%.  He will return for follow-up in the future in 4 months or call earlier if necessary.  Greater than 50% time during this 45-minute  visit was spent in counseling and coordination of care about his memory loss which has now progressed to mild mixed dementia as well as remote history of lacunar stroke and answering questions Delia Heady, MD Note: This document was prepared with digital dictation and possible smart phrase technology. Any transcriptional errors that result from this process are unintentional

## 2023-07-03 ENCOUNTER — Encounter (INDEPENDENT_AMBULATORY_CARE_PROVIDER_SITE_OTHER): Payer: Self-pay

## 2023-07-07 ENCOUNTER — Telehealth: Payer: Self-pay | Admitting: Neurology

## 2023-07-07 NOTE — Telephone Encounter (Signed)
sent to GI they obtain [employer] medicare auth [id number] 

## 2023-07-15 ENCOUNTER — Telehealth: Payer: Self-pay | Admitting: Neurology

## 2023-07-15 NOTE — Telephone Encounter (Signed)
[employer] Case [id number]  I submitted a PA for the pet scan but they want the MRI results before they will approve it. The MRI is scheduled on 12/19. I will pend the pet scan order to resubmit after the MRI results are available.

## 2023-07-17 LAB — APOE ALZHEIMER'S RISK

## 2023-07-19 NOTE — Progress Notes (Signed)
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

## 2023-07-20 ENCOUNTER — Encounter: Payer: Self-pay | Admitting: Neurology

## 2023-07-26 ENCOUNTER — Other Ambulatory Visit: Payer: Self-pay | Admitting: Neurology

## 2023-07-28 ENCOUNTER — Other Ambulatory Visit: Payer: Self-pay | Admitting: Neurology

## 2023-07-30 NOTE — Telephone Encounter (Signed)
[employer] medicare Berkley Harvey: [id number] exp. 07/30/23-01/26/24

## 2023-08-06 ENCOUNTER — Ambulatory Visit
Admission: RE | Admit: 2023-08-06 | Discharge: 2023-08-06 | Disposition: A | Discharge status: Home / Self Care | Payer: Medicare HMO | Source: Ambulatory Visit | Attending: Neurology | Admitting: Neurology

## 2023-08-06 DIAGNOSIS — G309 Alzheimer's disease, unspecified: Secondary | ICD-10-CM | POA: Diagnosis not present

## 2023-08-06 MED ORDER — GADOPICLENOL 0.5 MMOL/ML IV SOLN
10.0000 mL | Freq: Once | INTRAVENOUS | Status: AC | PRN
  Administered 2023-08-06: 10 mL via INTRAVENOUS

## 2023-08-09 NOTE — Progress Notes (Signed)
Kindly inform the patient that MRI scan study of the brain shows changes of age-related small vessel disease and mild degree of shrinkage of the brain which appears slightly progressed compared with previous MRI from 4 years ago.  No new or worrisome findings

## 2023-08-10 ENCOUNTER — Telehealth: Payer: Self-pay

## 2023-08-10 NOTE — Telephone Encounter (Signed)
-----   Message from Delia Heady sent at 08/09/2023  3:28 PM EST ----- Kindly inform the patient that MRI scan study of the brain shows changes of age-related small vessel disease and mild degree of shrinkage of the brain which appears slightly progressed compared with previous MRI from 4 years ago.  No new or worrisome findings

## 2023-08-10 NOTE — Telephone Encounter (Signed)
Called patient and spoke with the patient and his daughter. I have informed them that  MRI scan study of the brain shows changes of age-related small vessel disease and mild degree of shrinkage of the brain which appears slightly progressed compared with previous MRI from 4 years ago.  No new or worrisome findings. Pt and daughter verbalized understanding. Pt and daughter had no questions at this time but was encouraged to call back if questions arise.

## 2023-08-14 NOTE — Telephone Encounter (Signed)
I had someone from [employer] reach out inquiring about the PET scan and insurance auth now that MRI has been completed. I advised I would send a message to our person that works on these and have her follow up.

## 2023-08-20 NOTE — Telephone Encounter (Signed)
 I tried to resubmit the PA for the pet scan and they said has to be after 60 days after the denied PA, which was submitted on 11/19. I can try again at the end of January with the MRI results this time.

## 2023-09-16 NOTE — Telephone Encounter (Signed)
I submitted a new PA with all the office notes, labs and MRI results. [employer] [id number]

## 2023-09-24 NOTE — Telephone Encounter (Signed)
 I received a fax from [employer] stating this health plan does not cover this study. I will put the fax in the pod for review.

## 2023-12-24 ENCOUNTER — Ambulatory Visit
Admission: EM | Admit: 2023-12-24 | Discharge: 2023-12-24 | Disposition: A | Discharge status: Home / Self Care | Attending: Family Medicine | Admitting: Family Medicine

## 2023-12-24 DIAGNOSIS — M25561 Pain in right knee: Secondary | ICD-10-CM | POA: Diagnosis not present

## 2023-12-24 MED ORDER — PREDNISONE 20 MG PO TABS: 40.0000 mg | ORAL_TABLET | Freq: Every day | ORAL | 0 refills | Status: DC

## 2023-12-24 NOTE — ED Triage Notes (Signed)
 Pt reports he has right knee swelling x 2 weeks   Used hemp oil on his right knee

## 2023-12-24 NOTE — Discharge Instructions (Signed)
 I have prescribed prednisone  today to help with pain and inflammation.  You may also use rest, ice, compression, elevation, over-the-counter pain relievers and topical rubs.  Follow-up with orthopedics if not resolving

## 2023-12-24 NOTE — ED Provider Notes (Signed)
 [facility]    CSN: [id number] Arrival date & time: 12/24/23  1858      History   Chief Complaint No chief complaint on file.   HPI Ralph Martinez is a 73 y.o. male.   Patient presenting today with 2-week history of right knee swelling, pain, stiffness.  Denies redness, numbness, tingling, injury to the area, loss of range of motion.  So far trying CBD oil with mild temporary benefit.  Does have a history of gout but states this does not feel similar.    Past Medical History:  Diagnosis Date   Basal cell carcinoma 2022   left shoulder   COVID-19 06/30/2020   treated w/ infusion   Gout    High cholesterol    Hypertension    Kidney stones    Mild cognitive impairment 07/30/2020   per Dr. Janett Medin OV note on 07/30/2020   Stroke (HCC) 12/01/2018   R thalamic s/p IV TPA    Patient Active Problem List   Diagnosis Date Noted   Essential hypertension 12/02/2018   Hyperlipidemia LDL goal <70 12/02/2018   Renal insufficiency 12/02/2018   CVA (cerebral vascular accident) (HCC) R thalamic s/p IV tPA 12/01/2018   Acute cholecystitis 07/13/2016    Past Surgical History:  Procedure Laterality Date   CHOLECYSTECTOMY N/A 07/14/2016   Procedure: LAPAROSCOPIC CHOLECYSTECTOMY;  Surgeon: Franki Isles, MD;  Location: [facility];  Service: General;  Laterality: N/A;   CYSTOSCOPY WITH LITHOLAPAXY Right 08/09/2021   Procedure: CYSTOSCOPY WITH LITHOLAPAXY RIGHT RETROGRADE RIGHT URETEROSCOPY WIHT LASER AND STENT;  Surgeon: Homero Luster, MD;  Location: [facility];  Service: Urology;  Laterality: Right;   KIDNEY STONE SURGERY         Home Medications    Prior to Admission medications   Medication Sig Start Date End Date Taking? Authorizing Provider  predniSONE  (DELTASONE ) 20 MG tablet Take 2 tablets (40 mg total) by mouth daily with breakfast. 12/24/23  Yes Corbin Dess, PA-C  allopurinol (ZYLOPRIM) 100 MG tablet Take 200 mg by mouth daily. 05/20/23    [provider]  amLODipine (NORVASC) 5 MG tablet Take 5 mg by mouth every evening. Patient not taking: Reported on 07/02/2023    [provider]  aspirin  EC 81 MG EC tablet Take 1 tablet (81 mg total) by mouth daily. 12/02/18   Rodman Clam, NP  atorvastatin  (LIPITOR) 40 MG tablet Take 1 tablet (40 mg total) by mouth daily at 6 PM. Patient taking differently: Take 40 mg by mouth daily. 12/02/18   Rodman Clam, NP  cephALEXin  (KEFLEX ) 500 MG capsule Take 1 capsule (500 mg total) by mouth 4 (four) times daily. Patient not taking: Reported on 07/02/2023 08/14/21   Catherne Clubs, PA-C  fexofenadine (ALLEGRA) 60 MG tablet Take 60 mg by mouth 2 (two) times daily.    [provider]  memantine  (NAMENDA  TITRATION PACK) tablet pack 5 MG/DAY FOR =1 WEEK 5 MG TWICE DAILY FOR =1 WEEK 15 MG/DAY GIVEN IN 5 MG AND 10 MG SEPARATED DOSES FOR =1 WEEK THEN 10 MG TWICE DAILY 07/28/23   Sethi, Pramod S, MD  memantine  (NAMENDA ) 10 MG tablet TAKE 1 TABLET BY MOUTH TWICE A DAY 07/28/23   Sethi, Pramod S, MD  oxyCODONE -acetaminophen  (PERCOCET/ROXICET) 5-325 MG tablet Take 1 tablet by mouth every 6 (six) hours as needed for severe pain. Patient not taking: Reported on 07/02/2023 08/14/21   Triplett, Tammy, PA-C  phenazopyridine  (PYRIDIUM ) 200 MG tablet  Take 1 tablet (200 mg total) by mouth 3 (three) times daily as needed for pain. Patient not taking: Reported on 07/02/2023 08/09/21   Wrenn, John, MD  potassium citrate (UROCIT-K) 10 MEQ (1080 MG) SR tablet Take 10 mEq by mouth 3 (three) times daily. 03/09/23   [provider]  predniSONE  (STERAPRED UNI-PAK 21 TAB) 10 MG (21) TBPK tablet Take 10 mg by mouth daily. 06/29/23   [provider]  tamsulosin (FLOMAX) 0.4 MG CAPS capsule Take 0.4 mg by mouth daily. 05/05/23   [provider]  valsartan (DIOVAN) 320 MG tablet Take 320 mg by mouth daily. 10/19/18   [provider]    Family History Family History   Problem Relation Age of Onset   Hypertension Mother    Hypertension Father    Rheum arthritis Sister     Social History Social History   Tobacco Use   Smoking status: Former    Current packs/day: 0.00    Average packs/day: 0.5 packs/day for 7.0 years (3.5 ttl pk-yrs)    Types: Cigarettes    Start date: 07/23/1971    Quit date: 07/22/1978    Years since quitting: 45.4   Smokeless tobacco: Former    Types: Snuff, Chew    Quit date: 2000  Vaping Use   Vaping status: Never Used  Substance Use Topics   Alcohol  use: Not Currently    Comment: occasional   Drug use: No     Allergies   Codeine and Erythromycin   Review of Systems Review of Systems Per HPI  Physical Exam Triage Vital Signs ED Triage Vitals  Encounter Vitals Group     BP 12/24/23 1913 132/79     Systolic BP Percentile --      Diastolic BP Percentile --      Pulse Rate 12/24/23 1913 78     Resp 12/24/23 1913 20     Temp 12/24/23 1913 98 F (36.7 C)     Temp Source 12/24/23 1913 Oral     SpO2 12/24/23 1913 94 %     Weight --      Height --      Head Circumference --      Peak Flow --      Pain Score 12/24/23 1915 3     Pain Loc --      Pain Education --      Exclude from Growth Chart --    No data found.  Updated Vital Signs BP 132/79 (BP Location: Right Arm)   Pulse 78   Temp 98 F (36.7 C) (Oral)   Resp 20   SpO2 94%   Visual Acuity Right Eye Distance:   Left Eye Distance:   Bilateral Distance:    Right Eye Near:   Left Eye Near:    Bilateral Near:     Physical Exam Vitals and nursing note reviewed.  Constitutional:      Appearance: Normal appearance.  HENT:     Head: Atraumatic.  Eyes:     Extraocular Movements: Extraocular movements intact.     Conjunctiva/sclera: Conjunctivae normal.  Cardiovascular:     Rate and Rhythm: Normal rate.  Pulmonary:     Effort: Pulmonary effort is normal.  Musculoskeletal:        General: Swelling present. No tenderness or signs of  injury. Normal range of motion.     Cervical back: Normal range of motion and neck supple.     Comments: Trace edema to the right  knee anteriorly.  No joint instability to the right knee, negative McMurray's and drawer testing  Skin:    General: Skin is warm and dry.     Findings: No bruising or erythema.  Neurological:     Mental Status: He is oriented to person, place, and time.     Motor: No weakness.     Gait: Gait normal.     Comments: Right lower extremity neurovascularly intact  Psychiatric:        Mood and Affect: Mood normal.        Thought Content: Thought content normal.        Judgment: Judgment normal.      UC Treatments / Results  Labs (all labs ordered are listed, but only abnormal results are displayed) Labs Reviewed - No data to display  EKG   Radiology No results found.  Procedures Procedures (including critical care time)  Medications Ordered in UC Medications - No data to display  Initial Impression / Assessment and Plan / UC Course  I have reviewed the triage vital signs and the nursing notes.  Pertinent labs & imaging results that were available during my care of the patient were reviewed by me and considered in my medical decision making (see chart for details).     Suspect inflammatory cause, possibly arthritis flare versus tendinitis.  Will treat with prednisone , topical rubs and Ace wrap.  RICE protocol reviewed.  Final Clinical Impressions(s) / UC Diagnoses   Final diagnoses:  Acute pain of right knee     Discharge Instructions      I have prescribed prednisone  today to help with pain and inflammation.  You may also use rest, ice, compression, elevation, over-the-counter pain relievers and topical rubs.  Follow-up with orthopedics if not resolving  ED Prescriptions     Medication Sig Dispense Auth. Provider   predniSONE  (DELTASONE ) 20 MG tablet Take 2 tablets (40 mg total) by mouth daily with breakfast. 10 tablet Corbin Dess, [place]      PDMP not reviewed this encounter.   Corbin Dess, [place] 12/24/23 2002

## 2024-01-26 ENCOUNTER — Encounter (INDEPENDENT_AMBULATORY_CARE_PROVIDER_SITE_OTHER): Payer: Self-pay | Admitting: *Deleted

## 2024-02-22 ENCOUNTER — Ambulatory Visit: Payer: Medicare HMO | Admitting: Neurology

## 2024-02-22 ENCOUNTER — Encounter: Payer: Self-pay | Admitting: Neurology

## 2024-02-22 ENCOUNTER — Other Ambulatory Visit: Payer: Self-pay | Admitting: Neurology

## 2024-02-22 VITALS — BP 106/66 | HR 86 | Ht 72.0 in | Wt 220.0 lb

## 2024-02-22 DIAGNOSIS — F0150 Vascular dementia without behavioral disturbance: Secondary | ICD-10-CM

## 2024-02-22 DIAGNOSIS — F0280 Dementia in other diseases classified elsewhere without behavioral disturbance: Secondary | ICD-10-CM | POA: Diagnosis not present

## 2024-02-22 DIAGNOSIS — R413 Other amnesia: Secondary | ICD-10-CM | POA: Diagnosis not present

## 2024-02-22 DIAGNOSIS — Z8673 Personal history of transient ischemic attack (TIA), and cerebral infarction without residual deficits: Secondary | ICD-10-CM | POA: Diagnosis not present

## 2024-02-22 DIAGNOSIS — F03911 Unspecified dementia, unspecified severity, with agitation: Secondary | ICD-10-CM

## 2024-02-22 DIAGNOSIS — G309 Alzheimer's disease, unspecified: Secondary | ICD-10-CM

## 2024-02-22 MED ORDER — MEMANTINE HCL 10 MG PO TABS: 10.0000 mg | ORAL_TABLET | Freq: Two times a day (BID) | ORAL | 2 refills | Status: AC

## 2024-02-22 MED ORDER — DIVALPROEX SODIUM ER 500 MG PO TB24: 500.0000 mg | ORAL_TABLET | Freq: Every day | ORAL | 5 refills | Status: AC

## 2024-02-22 NOTE — Patient Instructions (Signed)
 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   I recommend he continue Namenda  10 mg twice daily which he is tolerating well without side effects.  He was also advised to increase participation in cognitively challenging activities like solving crossword puzzles, playing bridge and sudoku.  We also discussed memory compensation strategies.  I also recommend a trial of Depakote  ER 500 mg daily to help with the behavioral agitation.  Continue aspirin  for stroke prevention and maintain aggressive risk factor modification with strict control of hypertension with blood pressure goal below 130/90, lipids with LDL cholesterol goal below 70 mg percent and diabetes with hemoglobin A1c goal below 6.5%.  He will return for follow-up in the future in 6 months or call earlier if necessary.

## 2024-02-22 NOTE — Progress Notes (Signed)
 [facility] [address] [place]. [place] [address] [phone]       OFFICE FOLLOW-UP NOTE  [name] Date of Birth:  [birth year] Medical Record Number:  [id number]   HPI: [name] is a 73-year African-American male seen today for initial office follow-up visit following hospital admission for stroke in April 2020.  History is obtained from the patient and review of electronic medical records.  I personally reviewed imaging films. [name] is a 73 y.o. male with history of kidney stones, hypertension, hypercholesterolemia.  Patient states that he got up around 6 AM this morning 12/01/2018 without symptoms.  He was making a pot of coffee and took his blood pressure medications.  At approximately 7:00 (LKW 715) he developed numbness that went from his leg up to his arm which then proceeded to include his face with significant left leg weakness.  He denies any other symptoms.  He did call EMS which brought him to [facility].  Patient was administered TPA there and sent to [facility] for further monitoring.  Upon arrival patient had significantly improved.  The only findings were a left facial droop.  Patient feels as though he is back to normal.  He denies taking aspirin  on a daily basis. Premorbid modified Rankin scale (mRS): 0. NIH stroke score: 1. CT scan of the bed brain showed no acute abnormality.  MRI scan showed a small right thalamic lacunar infarct.  MRI of the brain showed no significant large vessel intracranial stenosis.  Carotid ultrasound showed no significant extracranial stenosis.  LDL cholesterol was elevated at 103 mg percent.  Hemoglobin A1c was 5.4.  Patient was started on aspirin  81 and Plavix  75 mg daily for 3 weeks followed by aspirin  alone.  He states is done well since discharge.  He still gets occasional intermittent left leg numbness but this is transient and appears to be more prominent when he is tired or exhausted.  He has had no recurrent  stroke or TIA symptoms.  He states his blood pressure is well controlled.  It is 147/81 today in our office.  The patient states that interestingly he had removed several ticks from his legs few days prior to his stroke symptoms.  He however did not have the classical erythematous rash or arthralgias from tick bite.  Patient states is tolerating aspirin  well without bruising or bleeding.  Is also tolerating Lipitor well without muscle aches and pains.  He is quite physically active and exercises every day.  He is eating healthy and has not gained any weight.  The patient has been dipping snuff but he knows that he has to quit that. Update 07/21/2019 ; he returns for follow-up after last visit 6 months ago.  Continues to do well without recurrent stroke or TIA symptoms.  He still has intermittent paresthesias in his left leg but these are not as disabling and appear to be improving compared to last visit.  He is pretty active and is able to do almost everything that he did before the stroke.  He remains on aspirin  which is tolerating well without bleeding or bruising.  Blood pressures well controlled and today it is 130/80.  He remains on Lipitor which is tolerating well without muscle aches and pain.  Follow-up LDL cholesterol on 01/19/2019 was 70 mg percent.  He has no new complaints today. Update 07/30/2020 : He returns for follow-up after last visit a year ago.  The patient is alone but he  is accompanied by his wife whom he did not permit to come into the room with her.  However she slipped me is note stating that she is concerned that patient has been having some paranoia recently thinks people are watching the house and yelling at him.  He is having some bad mood swings as well gets angry over little things.  He has trouble remembering recent recent information and our 2-hour he often loses things.  Wife is concerned about dementia.  She also feels that he stumbles a lot.  Patient himself denies any symptoms.   He denies any new stroke or TIA-like symptoms.  On the Mini-Mental status exam he scored 26 with only deficits in attention calculation.  On depression scale he was not found to be depressed.  His is tolerating aspirin  well without bruising bleeding.  Blood pressures well controlled and today it is 139/81.  Is remains on Lipitor which is tolerating well without muscle aches and pains.  He has an upcoming visit with his primary care physician next month for annual physical and will have lab work done.  His last carotid ultrasound was done on 08/04/2019 was unremarkable. Update 07/02/2023 : He returns for follow-up after last visit nearly 3 years ago.  He is accompanied by his daughter.  They have noticed worsening of memory and cognitive difficulties gradually.  He has trouble remembering recent information and has to be reminded about appointments and taking his medicines constantly.  He is still quite independent and lives at home with his wife and daughter and manage his activities of daily living.  He is still driving.  He has never gotten lost.  He did denies any hallucinations, delusions or unsafe behavior.  He has not exhibited any agitation or violent behavior.  He has not had any recent brain imaging done.  At last visit I had ordered lab work and B12, TSH, RPR mecysteine was normal.  I recommend he take Cerefolin NAC.  He has not been taking it.  He has not had recurrent stroke or TIA symptoms.  He remains on aspirin  which is tolerating well without bleeding or bruising.  He is tolerating Lipitor well without muscle aches and pains.  His blood pressure is well-controlled on Diovan.  He has not had any recent lab work for cholesterol or diabetes check.  He does have family history of dementia in his father in his early 58s.  He recently had a flareup of his gout in his foot and is on prednisone . Update 71/02/2024 : He returns for follow-up after last visit more than 6 months ago.  He is accompanied by his  wife.  He is tolerating Namenda  well without side effects.  Wife feels his cognitive impairment and memory difficulties are more or less unchanged.  He does get forgetful at times and confused particularly towards the afternoon and evening hours.  He keeps on asking the same questions over and over again.  He appears to get frustrated and agitated quite often.  He has not exhibited any violent behavior.  He does not have any delusions or hallucinations.  He is not exhibiting any unsafe behavior.  He can ambulate independently without assistance with good balance.  He has had no falls or injuries.  He remains on aspirin  which is tolerating well without any bruising or side effects.  His blood pressures under good control.  He is tolerating Lipitor well without muscle aches and pains.  His are no recurrent stroke or TIA symptoms.  He does complain of indigestion and saw his primary care physician who has plans to do a colonoscopy.  He also follows up with urologist for his kidney stones which are too large to remove.  Lab work at last visit for Alzheimer's risk showed single apoprotein E3 and E4 LEs.  Amyloid PET scan was ordered but for unclear reason was not done. ROS:   14 system review of systems is positive for numbness and tingling, paranoia, agitation, irritability, memory difficulties, mood swings, getting angry, memory difficulties, stumbling only and all other systems negative  PMH:  Past Medical History:  Diagnosis Date   Basal cell carcinoma 2022   left shoulder   COVID-19 06/30/2020   treated w/ infusion   Gout    High cholesterol    Hypertension    Kidney stones    Mild cognitive impairment 07/30/2020   per Dr. Rosemarie OV note on 07/30/2020   Stroke (HCC) 12/01/2018   R thalamic s/p IV TPA    Social History:  Social History   Socioeconomic History   Marital status: Married    Spouse name: Lobbyist   Number of children: 2   Years of education: Not on file   Highest education  level: High school graduate  Occupational History   Occupation: retired    Comment: retired  Tobacco Use   Smoking status: Former    Current packs/day: 0.00    Average packs/day: 0.5 packs/day for 7.0 years (3.5 ttl pk-yrs)    Types: Cigarettes    Start date: 07/23/1971    Quit date: 07/22/1978    Years since quitting: 45.6   Smokeless tobacco: Former    Types: Snuff, Chew    Quit date: 2000  Vaping Use   Vaping status: Never Used  Substance and Sexual Activity   Alcohol  use: Not Currently    Comment: occasional   Drug use: No   Sexual activity: Not on file  Other Topics Concern   Not on file  Social History Narrative   Lives with wife and daughter   Caffeine- coffee 1 cup, 1 soda a day, green tea 1 glass   Social Drivers of Corporate investment banker Strain: Not on file  Food Insecurity: Not on file  Transportation Needs: Not on file  Physical Activity: Not on file  Stress: Not on file  Social Connections: Not on file  Intimate Partner Violence: Not on file    Medications:   Current Outpatient Medications on File Prior to Visit  Medication Sig Dispense Refill   allopurinol (ZYLOPRIM) 100 MG tablet Take 200 mg by mouth daily.     aspirin  EC 81 MG EC tablet Take 1 tablet (81 mg total) by mouth daily.     atorvastatin  (LIPITOR) 40 MG tablet Take 1 tablet (40 mg total) by mouth daily at 6 PM. 30 tablet 2   fexofenadine (ALLEGRA) 60 MG tablet Take 60 mg by mouth 2 (two) times daily.     memantine  (NAMENDA ) 10 MG tablet TAKE 1 TABLET BY MOUTH TWICE A DAY 180 tablet 2   potassium citrate (UROCIT-K) 10 MEQ (1080 MG) SR tablet Take 10 mEq by mouth 3 (three) times daily.     tamsulosin (FLOMAX) 0.4 MG CAPS capsule Take 0.4 mg by mouth daily.     valsartan (DIOVAN) 320 MG tablet Take 320 mg by mouth daily.     No current facility-administered medications on file prior to visit.    Allergies:   Allergies  Allergen  Reactions   Codeine Hives   Erythromycin Hives and  Itching    Physical Exam General: Frail middle-aged African-American male, seated, in no evident distress Head: head normocephalic and atraumatic.  Neck: supple with no carotid or supraclavicular bruits Cardiovascular: regular rate and rhythm, no murmurs Musculoskeletal: no deformity Skin:  no rash/petichiae Vascular:  Normal pulses all extremities Vitals:   02/22/24 1447  BP: 106/66  Pulse: 86   Neurologic Exam Mental Status: Awake and fully alert. Oriented to place and time. Recent and remote memory intact. Attention span, concentration and fund of knowledge appropriate. Mood and affect appropriate.  Diminished recall 2/3.  Able to name 14 animals which can walk on 4 legs.  Clock drawing 3/4.  Mini-Mental status exam score 19/30 ( slight decline from last visit 20/30  )with deficits mostly inattention and recall.  He had difficulty in copying intersecting pentagons. Cranial Nerves: Fundoscopic exam not done Pupils equal, briskly reactive to light. Extraocular movements full without nystagmus. Visual fields full to confrontation. Hearing intact. Facial sensation intact. Face, tongue, palate moves normally and symmetrically.  Motor: Normal bulk and tone. Normal strength in all tested extremity muscles. Sensory.: intact to touch ,pinprick .position and vibratory sensation.  But subjective paresthesias in the left leg lateral aspect. Coordination: Rapid alternating movements normal in all extremities. Finger-to-nose and heel-to-shin performed accurately bilaterally. Gait and Station: Arises from chair without difficulty. Stance is normal. Gait demonstrates normal stride length and balance . Not able to heel, toe and tandem walk without difficulty.  Reflexes: 1+ and symmetric. Toes downgoing.        02/22/2024    2:53 PM 07/02/2023    2:08 PM 07/30/2020    8:34 AM  MMSE - Mini Mental State Exam  Orientation to time 1 2 5   Orientation to Place 5 5 5   Registration 3 3 3   Attention/  Calculation 1 1 1   Recall 1 3 3   Language- name 2 objects 2 2 2   Language- repeat 1 0 1  Language- follow 3 step command 3 3 3   Language- read & follow direction 1 0 1  Write a sentence 1 1 1   Copy design 0 0 1  Total score 19 20 26       ASSESSMENT: 73 year-old African-American male with small right thalamic lacunar infarct in April 2020 secondary to small vessel disease.  He is doing well except for mild post stroke paresthesias which appear to be improving..  Vascular risk factors of hypertension hyperlipidemia and age only.  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  He has shown stabilization on Namenda  but is having behavioral agitation issues.    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   I recommend he continue Namenda  10 mg twice daily which he is tolerating well without side effects.  He was also advised to increase participation in cognitively challenging activities like solving crossword puzzles, playing bridge and sudoku.  We also discussed memory compensation strategies.  I also recommend a trial of Depakote  ER 500 mg daily to help with the behavioral agitation.  Continue aspirin  for stroke prevention and maintain aggressive risk factor modification with strict control of hypertension with blood pressure goal below 130/90, lipids with LDL cholesterol goal below 70 mg percent and diabetes with hemoglobin A1c goal  below 6.5%.  He will return for follow-up in the future in 6 months or call earlier if necessary.  Greater than 50% time during this 40-minute  visit was spent in counseling and coordination of care about his memory loss , dementia, behavioral agitation, discussion about stroke risk, discussion with patient and wife and review of  available testing and imaging studies and answering questions Eather Popp, MD Note: This document was prepared with digital dictation and possible smart phrase technology. Any transcriptional errors that result from this process are unintentional

## 2024-03-11 ENCOUNTER — Ambulatory Visit: Admitting: Urology

## 2024-07-27 ENCOUNTER — Encounter (INDEPENDENT_AMBULATORY_CARE_PROVIDER_SITE_OTHER): Payer: Self-pay | Admitting: *Deleted

## 2024-12-29 ENCOUNTER — Ambulatory Visit: Admitting: Neurology
